# Patient Record
Sex: Female | Born: 1990 | Race: Black or African American | Hispanic: No | Marital: Single | Smoking: Never smoker
Health system: Southern US, Community
[De-identification: ages and names within clinical notes are randomized; demographics above are authoritative.]

## PROBLEM LIST (undated history)

## (undated) DIAGNOSIS — E109 Type 1 diabetes mellitus without complications: Secondary | ICD-10-CM

## (undated) DIAGNOSIS — B019 Varicella without complication: Secondary | ICD-10-CM

## (undated) DIAGNOSIS — B977 Papillomavirus as the cause of diseases classified elsewhere: Secondary | ICD-10-CM

## (undated) DIAGNOSIS — N390 Urinary tract infection, site not specified: Secondary | ICD-10-CM

## (undated) DIAGNOSIS — E119 Type 2 diabetes mellitus without complications: Secondary | ICD-10-CM

## (undated) DIAGNOSIS — R87629 Unspecified abnormal cytological findings in specimens from vagina: Secondary | ICD-10-CM

## (undated) DIAGNOSIS — E1110 Type 2 diabetes mellitus with ketoacidosis without coma: Secondary | ICD-10-CM

## (undated) DIAGNOSIS — R000 Tachycardia, unspecified: Secondary | ICD-10-CM

## (undated) DIAGNOSIS — E669 Obesity, unspecified: Secondary | ICD-10-CM

## (undated) HISTORY — PX: COLPOSCOPY: SHX161

## (undated) HISTORY — PX: NO PAST SURGERIES: SHX2092

## (undated) HISTORY — DX: Varicella without complication: B01.9

## (undated) HISTORY — DX: Urinary tract infection, site not specified: N39.0

## (undated) HISTORY — DX: Unspecified abnormal cytological findings in specimens from vagina: R87.629

---
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
## 2009-08-12 ENCOUNTER — Other Ambulatory Visit: Admission: RE | Admit: 2009-08-12 | Discharge: 2009-08-12 | Payer: Self-pay | Admitting: Obstetrics and Gynecology

## 2010-03-15 ENCOUNTER — Other Ambulatory Visit: Admission: RE | Admit: 2010-03-15 | Discharge: 2010-03-15 | Payer: Self-pay | Admitting: Obstetrics and Gynecology

## 2010-09-13 ENCOUNTER — Other Ambulatory Visit: Payer: Self-pay | Admitting: Nurse Practitioner

## 2010-09-13 ENCOUNTER — Other Ambulatory Visit (HOSPITAL_COMMUNITY)
Admission: RE | Admit: 2010-09-13 | Discharge: 2010-09-13 | Disposition: A | Discharge status: Home / Self Care | Payer: 59 | Source: Ambulatory Visit | Attending: Obstetrics and Gynecology | Admitting: Obstetrics and Gynecology

## 2010-09-13 DIAGNOSIS — Z113 Encounter for screening for infections with a predominantly sexual mode of transmission: Secondary | ICD-10-CM | POA: Insufficient documentation

## 2010-09-13 DIAGNOSIS — Z01419 Encounter for gynecological examination (general) (routine) without abnormal findings: Secondary | ICD-10-CM | POA: Insufficient documentation

## 2011-09-18 ENCOUNTER — Other Ambulatory Visit (HOSPITAL_COMMUNITY)
Admission: RE | Admit: 2011-09-18 | Discharge: 2011-09-18 | Disposition: A | Discharge status: Home / Self Care | Payer: 59 | Source: Ambulatory Visit | Attending: Obstetrics and Gynecology | Admitting: Obstetrics and Gynecology

## 2011-09-18 ENCOUNTER — Other Ambulatory Visit: Payer: Self-pay | Admitting: Nurse Practitioner

## 2011-09-18 DIAGNOSIS — Z01419 Encounter for gynecological examination (general) (routine) without abnormal findings: Secondary | ICD-10-CM | POA: Insufficient documentation

## 2011-09-18 DIAGNOSIS — N760 Acute vaginitis: Secondary | ICD-10-CM | POA: Insufficient documentation

## 2011-09-18 DIAGNOSIS — Z113 Encounter for screening for infections with a predominantly sexual mode of transmission: Secondary | ICD-10-CM | POA: Insufficient documentation

## 2012-09-18 ENCOUNTER — Other Ambulatory Visit (HOSPITAL_COMMUNITY)
Admission: RE | Admit: 2012-09-18 | Discharge: 2012-09-18 | Disposition: A | Discharge status: Home / Self Care | Payer: 59 | Source: Ambulatory Visit | Attending: Obstetrics and Gynecology | Admitting: Obstetrics and Gynecology

## 2012-09-18 ENCOUNTER — Other Ambulatory Visit: Payer: Self-pay | Admitting: Nurse Practitioner

## 2012-09-18 DIAGNOSIS — N760 Acute vaginitis: Secondary | ICD-10-CM | POA: Insufficient documentation

## 2012-09-18 DIAGNOSIS — Z113 Encounter for screening for infections with a predominantly sexual mode of transmission: Secondary | ICD-10-CM | POA: Insufficient documentation

## 2013-09-03 ENCOUNTER — Other Ambulatory Visit: Payer: Self-pay | Admitting: Nurse Practitioner

## 2013-09-03 ENCOUNTER — Other Ambulatory Visit (HOSPITAL_COMMUNITY)
Admission: RE | Admit: 2013-09-03 | Discharge: 2013-09-03 | Disposition: A | Discharge status: Home / Self Care | Payer: 59 | Source: Ambulatory Visit | Attending: Nurse Practitioner | Admitting: Nurse Practitioner

## 2013-09-03 DIAGNOSIS — Z113 Encounter for screening for infections with a predominantly sexual mode of transmission: Secondary | ICD-10-CM | POA: Insufficient documentation

## 2013-09-03 DIAGNOSIS — Z01419 Encounter for gynecological examination (general) (routine) without abnormal findings: Secondary | ICD-10-CM | POA: Insufficient documentation

## 2013-10-01 ENCOUNTER — Ambulatory Visit (INDEPENDENT_AMBULATORY_CARE_PROVIDER_SITE_OTHER): Payer: 59 | Admitting: Physician Assistant

## 2013-10-01 VITALS — BP 110/72 | HR 81 | Temp 99.5°F | Resp 18 | Ht 64.5 in | Wt 155.0 lb

## 2013-10-01 DIAGNOSIS — J029 Acute pharyngitis, unspecified: Secondary | ICD-10-CM

## 2013-10-01 DIAGNOSIS — R05 Cough: Secondary | ICD-10-CM

## 2013-10-01 DIAGNOSIS — R059 Cough, unspecified: Secondary | ICD-10-CM

## 2013-10-01 LAB — POCT RAPID STREP A (OFFICE): Rapid Strep A Screen: NEGATIVE

## 2013-10-01 MED ORDER — FIRST-DUKES MOUTHWASH MT SUSP: 5.0000 mL | OROMUCOSAL | Status: DC | PRN

## 2013-10-01 NOTE — Progress Notes (Signed)
   Subjective:    Patient ID: Tammy Richards, female    DOB: 04/15/1991, 23 y.o.   MRN: [id number]  HPI 23 year old female presents for evaluation of sore throat. Symptoms started yesterday and have persisted through today.  She states it is painful to swallow despite use of cough drops.   Took Nyquil last night and Dayquil today which did not help much. Admits to slight "tickle" in her throat that is causing a dry cough. No SOB, wheezing, nasal congestion, otalgia, sinus pain, headache, fever, chills, nausea, or vomiting.  No significant hx of frequent strep infections or known strep contacts.  Patient is otherwise healthy with no other concerns today.     Review of Systems  Constitutional: Negative for fever and chills.  HENT: Positive for sore throat. Negative for congestion, postnasal drip, rhinorrhea, sinus pressure and trouble swallowing.   Respiratory: Positive for cough. Negative for chest tightness, shortness of breath and wheezing.   Cardiovascular: Negative for chest pain.  Gastrointestinal: Negative for nausea, vomiting and abdominal pain.  Neurological: Negative for dizziness and headaches.       Objective:   Physical Exam  Constitutional: She is oriented to person, place, and time. She appears well-developed and well-nourished.  HENT:  Head: Normocephalic and atraumatic.  Right Ear: Hearing, tympanic membrane, external ear and ear canal normal.  Left Ear: Hearing, tympanic membrane, external ear and ear canal normal.  Mouth/Throat: Uvula is midline and mucous membranes are normal. Posterior oropharyngeal erythema present. No oropharyngeal exudate, posterior oropharyngeal edema or tonsillar abscesses.  Eyes: Conjunctivae are normal.  Neck: Normal range of motion. Neck supple.  Cardiovascular: Normal rate, regular rhythm and normal heart sounds.   Pulmonary/Chest: Effort normal and breath sounds normal.  Lymphadenopathy:    She has no cervical adenopathy.  Neurological: She  is alert and oriented to person, place, and time.  Psychiatric: She has a normal mood and affect. Her behavior is normal. Judgment and thought content normal.          Results for orders placed in visit on 10/01/13  POCT RAPID STREP A (OFFICE)      Result Value Ref Range   Rapid Strep A Screen Negative  Negative    Assessment & Plan:  Acute pharyngitis - Plan: POCT rapid strep A, Culture, Group A Strep, Diphenhyd-Hydrocort-Nystatin (FIRST-DUKES MOUTHWASH) SUSP  Viral pharyngitis  Cough  Very likely viral pharyngitis. Will treat with conservative measures.  Recommend ibuprofen 800 mg tid with food.  D[facility]'s mouthwash q2-3hours prn pain.  Throat culture sent RTC precautions discussed.

## 2013-10-01 NOTE — Patient Instructions (Signed)
Ibuprofen 800 mg three times a day with food Use [facility]'s mouthwash every 2-3 hours as needed for pain Return if fever >100, trouble swallowing, or worsening cough     Viral Pharyngitis Viral pharyngitis is a viral infection that produces redness, pain, and swelling (inflammation) of the throat. It can spread from person to person (contagious). CAUSES Viral pharyngitis is caused by inhaling a large amount of certain germs called viruses. Many different viruses cause viral pharyngitis. SYMPTOMS Symptoms of viral pharyngitis include:  Sore throat.  Tiredness.  Stuffy nose.  Low-grade fever.  Congestion.  Cough. TREATMENT Treatment includes rest, drinking plenty of fluids, and the use of over-the-counter medication (approved by your caregiver). HOME CARE INSTRUCTIONS   Drink enough fluids to keep your urine clear or pale yellow.  Eat soft, cold foods such as ice cream, frozen ice pops, or gelatin dessert.  Gargle with warm salt water (1 tsp salt per 1 qt of water).  If over age 127, throat lozenges may be used safely.  Only take over-the-counter or prescription medicines for pain, discomfort, or fever as directed by your caregiver. Do not take aspirin. To help prevent spreading viral pharyngitis to others, avoid:  Mouth-to-mouth contact with others.  Sharing utensils for eating and drinking.  Coughing around others. SEEK MEDICAL CARE IF:   You are better in a few days, then become worse.  You have a fever or pain not helped by pain medicines.  There are any other changes that concern you. Document Released: 04/25/2005 Document Revised: 10/08/2011 Document Reviewed: 09/21/2010 [facility] Patient Information 2014 [place], [place].

## 2013-10-04 LAB — CULTURE, GROUP A STREP: Organism ID, Bacteria: NORMAL

## 2013-10-05 ENCOUNTER — Other Ambulatory Visit: Payer: Self-pay | Admitting: Physician Assistant

## 2013-10-05 MED ORDER — AMOXICILLIN 875 MG PO TABS: 875.0000 mg | ORAL_TABLET | Freq: Two times a day (BID) | ORAL | Status: DC

## 2014-06-22 ENCOUNTER — Ambulatory Visit (INDEPENDENT_AMBULATORY_CARE_PROVIDER_SITE_OTHER): Payer: 59 | Admitting: Internal Medicine

## 2014-06-22 VITALS — BP 120/74 | HR 86 | Temp 98.6°F | Resp 18 | Ht 63.75 in | Wt 167.8 lb

## 2014-06-22 DIAGNOSIS — J018 Other acute sinusitis: Secondary | ICD-10-CM

## 2014-06-22 DIAGNOSIS — R059 Cough, unspecified: Secondary | ICD-10-CM

## 2014-06-22 DIAGNOSIS — R05 Cough: Secondary | ICD-10-CM

## 2014-06-22 MED ORDER — HYDROCODONE-ACETAMINOPHEN 7.5-325 MG/15ML PO SOLN: 10.0000 mL | Freq: Four times a day (QID) | ORAL | Status: DC | PRN

## 2014-06-22 MED ORDER — AMOXICILLIN 500 MG PO CAPS: 1000.0000 mg | ORAL_CAPSULE | Freq: Two times a day (BID) | ORAL | Status: DC

## 2014-06-22 NOTE — Patient Instructions (Signed)
Allergic Rhinitis Allergic rhinitis is when the mucous membranes in the nose respond to allergens. Allergens are particles in the air that cause your body to have an allergic reaction. This causes you to release allergic antibodies. Through a chain of events, these eventually cause you to release histamine into the blood stream. Although meant to protect the body, it is this release of histamine that causes your discomfort, such as frequent sneezing, congestion, and an itchy, runny nose.  CAUSES  Seasonal allergic rhinitis (hay fever) is caused by pollen allergens that may come from grasses, trees, and weeds. Year-round allergic rhinitis (perennial allergic rhinitis) is caused by allergens such as house dust mites, pet dander, and mold spores.  SYMPTOMS   Nasal stuffiness (congestion).  Itchy, runny nose with sneezing and tearing of the eyes. DIAGNOSIS  Your health care provider can help you determine the allergen or allergens that trigger your symptoms. If you and your health care provider are unable to determine the allergen, skin or blood testing may be used. TREATMENT  Allergic rhinitis does not have a cure, but it can be controlled by:  Medicines and allergy shots (immunotherapy).  Avoiding the allergen. Hay fever may often be treated with antihistamines in pill or nasal spray forms. Antihistamines block the effects of histamine. There are over-the-counter medicines that may help with nasal congestion and swelling around the eyes. Check with your health care provider before taking or giving this medicine.  If avoiding the allergen or the medicine prescribed do not work, there are many new medicines your health care provider can prescribe. Stronger medicine may be used if initial measures are ineffective. Desensitizing injections can be used if medicine and avoidance does not work. Desensitization is when a patient is given ongoing shots until the body becomes less sensitive to the allergen.  Make sure you follow up with your health care provider if problems continue. HOME CARE INSTRUCTIONS It is not possible to completely avoid allergens, but you can reduce your symptoms by taking steps to limit your exposure to them. It helps to know exactly what you are allergic to so that you can avoid your specific triggers. SEEK MEDICAL CARE IF:   You have a fever.  You develop a cough that does not stop easily (persistent).  You have shortness of breath.  You start wheezing.  Symptoms interfere with normal daily activities. Document Released: 04/10/2001 Document Revised: 07/21/2013 Document Reviewed: 03/23/2013 ExitCare Patient Information 2015 [employer]. This information is not intended to replace advice given to you by your health care provider. Make sure you discuss any questions you have with your health care provider. Sinusitis Sinusitis is redness, soreness, and inflammation of the paranasal sinuses. Paranasal sinuses are air pockets within the bones of your face (beneath the eyes, the middle of the forehead, or above the eyes). In healthy paranasal sinuses, mucus is able to drain out, and air is able to circulate through them by way of your nose. However, when your paranasal sinuses are inflamed, mucus and air can become trapped. This can allow bacteria and other germs to grow and cause infection. Sinusitis can develop quickly and last only a short time (acute) or continue over a long period (chronic). Sinusitis that lasts for more than 12 weeks is considered chronic.  CAUSES  Causes of sinusitis include:  Allergies.  Structural abnormalities, such as displacement of the cartilage that separates your nostrils (deviated septum), which can decrease the air flow through your nose and sinuses and affect sinus   drainage.  Functional abnormalities, such as when the small hairs (cilia) that line your sinuses and help remove mucus do not work properly or are not present. SIGNS AND  SYMPTOMS  Symptoms of acute and chronic sinusitis are the same. The primary symptoms are pain and pressure around the affected sinuses. Other symptoms include:  Upper toothache.  Earache.  Headache.  Bad breath.  Decreased sense of smell and taste.  A cough, which worsens when you are lying flat.  Fatigue.  Fever.  Thick drainage from your nose, which often is green and may contain pus (purulent).  Swelling and warmth over the affected sinuses. DIAGNOSIS  Your health care provider will perform a physical exam. During the exam, your health care provider may:  Look in your nose for signs of abnormal growths in your nostrils (nasal polyps).  Tap over the affected sinus to check for signs of infection.  View the inside of your sinuses (endoscopy) using an imaging device that has a light attached (endoscope). If your health care provider suspects that you have chronic sinusitis, one or more of the following tests may be recommended:  Allergy tests.  Nasal culture. A sample of mucus is taken from your nose, sent to a lab, and screened for bacteria.  Nasal cytology. A sample of mucus is taken from your nose and examined by your health care provider to determine if your sinusitis is related to an allergy. TREATMENT  Most cases of acute sinusitis are related to a viral infection and will resolve on their own within 10 days. Sometimes medicines are prescribed to help relieve symptoms (pain medicine, decongestants, nasal steroid sprays, or saline sprays).  However, for sinusitis related to a bacterial infection, your health care provider will prescribe antibiotic medicines. These are medicines that will help kill the bacteria causing the infection.  Rarely, sinusitis is caused by a fungal infection. In theses cases, your health care provider will prescribe antifungal medicine. For some cases of chronic sinusitis, surgery is needed. Generally, these are cases in which sinusitis recurs  more than 3 times per year, despite other treatments. HOME CARE INSTRUCTIONS   Drink plenty of water. Water helps thin the mucus so your sinuses can drain more easily.  Use a humidifier.  Inhale steam 3 to 4 times a day (for example, sit in the bathroom with the shower running).  Apply a warm, moist washcloth to your face 3 to 4 times a day, or as directed by your health care provider.  Use saline nasal sprays to help moisten and clean your sinuses.  Take medicines only as directed by your health care provider.  If you were prescribed either an antibiotic or antifungal medicine, finish it all even if you start to feel better. SEEK IMMEDIATE MEDICAL CARE IF:  You have increasing pain or severe headaches.  You have nausea, vomiting, or drowsiness.  You have swelling around your face.  You have vision problems.  You have a stiff neck.  You have difficulty breathing. MAKE SURE YOU:   Understand these instructions.  Will watch your condition.  Will get help right away if you are not doing well or get worse. Document Released: 07/16/2005 Document Revised: 11/30/2013 Document Reviewed: 07/31/2011 ExitCare Patient Information 2015 Exit[employer], [employer]. This information is not intended to replace advice given to you by your health care provider. Make sure you discuss any questions you have with your health care provider.  

## 2014-06-22 NOTE — Progress Notes (Signed)
   Subjective:    Patient ID: Tammy Richards, female    DOB: 06/17/1991, 23 y.o.   MRN: [id number]  HPI A 23 year old female is here with complaints of sinus issues that started on last Saturday, 06/19/2014. Patient has history of acute pharyngitis dated back in March 2015. She states that she has had an illness similar to the one she has now that lasted around 6-7 weeks. Patient states she does have allergies but not necessarily during the spring time.  She usually takes OTC allergy medicines when needed.  She is presented with minor facial sinus pressure, nasal congestion and is producing yellow sputum.  She notes that she does not have an headache but describes it as pressure buildup between her eyes.   She states that she has occassionally cough.  She states she has not been around any one else that has been sick.  She states she does not have any history of asthma.  Patient noted that she is an non-smoker and is not around anyone that does smoke.    Patient has tried OTC Alka-Seltzer Plus Severe Sinus Congestion and Cough with no relief.  Patient states that her liquid and food intake are normal.  She also states that she is sleeping fine without having any interruptions due to her sickness.  She denies having any fever, chills, sore throat, nausea or vomiting associated with her issues today.   Patient states she is going out of town on Sunday going to [place] and wants to be feeling better by then.   Review of Systems     Objective:   Physical Exam  Constitutional: She is oriented to person, place, and time. She appears well-developed and well-nourished.  HENT:  Head: Normocephalic.  Right Ear: External ear normal.  Left Ear: External ear normal.  Nose: Mucosal edema, rhinorrhea and sinus tenderness present. Right sinus exhibits maxillary sinus tenderness. Right sinus exhibits no frontal sinus tenderness. Left sinus exhibits maxillary sinus tenderness. Left sinus exhibits no frontal  sinus tenderness.  Mouth/Throat: Oropharynx is clear and moist.  Eyes: Conjunctivae and EOM are normal. Pupils are equal, round, and reactive to light.  Cardiovascular: Normal rate.   Neurological: She is alert and oriented to person, place, and time. A cranial nerve deficit is present. She exhibits normal muscle tone. Coordination normal.  Psychiatric: She has a normal mood and affect.  Vitals reviewed.         Assessment & Plan:  Sinusitis/Cough Amoxil/Tussionex

## 2014-12-28 ENCOUNTER — Telehealth: Payer: Self-pay | Admitting: Family Medicine

## 2014-12-28 NOTE — Telephone Encounter (Signed)
Sorry but I am too full  

## 2014-12-28 NOTE — Telephone Encounter (Signed)
Pt not seen in several years.  Would like to know if you will accept her back as a pt?

## 2015-01-10 NOTE — Telephone Encounter (Signed)
Pt has been sch w/cory °

## 2015-02-03 ENCOUNTER — Encounter: Payer: Self-pay | Admitting: Adult Health

## 2015-02-03 ENCOUNTER — Ambulatory Visit (INDEPENDENT_AMBULATORY_CARE_PROVIDER_SITE_OTHER): Payer: Commercial Managed Care - HMO | Admitting: Adult Health

## 2015-02-03 VITALS — BP 122/88 | Temp 98.9°F | Ht 63.0 in | Wt 186.7 lb

## 2015-02-03 DIAGNOSIS — Z0000 Encounter for general adult medical examination without abnormal findings: Secondary | ICD-10-CM | POA: Diagnosis not present

## 2015-02-03 DIAGNOSIS — Z23 Encounter for immunization: Secondary | ICD-10-CM

## 2015-02-03 DIAGNOSIS — R300 Dysuria: Secondary | ICD-10-CM

## 2015-02-03 LAB — POCT URINALYSIS DIPSTICK
BILIRUBIN UA: NEGATIVE
Glucose, UA: NEGATIVE
Nitrite, UA: NEGATIVE
PH UA: 5.5
Protein, UA: NEGATIVE
RBC UA: NEGATIVE
Spec Grav, UA: >=1.03
Urobilinogen, UA: 0.2

## 2015-02-03 LAB — CBC WITH DIFFERENTIAL/PLATELET
Basophils Absolute: 0 10*3/uL (ref 0.0–0.1)
Basophils Relative: 0.6 % (ref 0.0–3.0)
EOS PCT: 1.8 % (ref 0.0–5.0)
Eosinophils Absolute: 0.1 10*3/uL (ref 0.0–0.7)
HCT: 43.6 % (ref 36.0–46.0)
Hemoglobin: 14.2 g/dL (ref 12.0–15.0)
Lymphocytes Relative: 35.7 % (ref 12.0–46.0)
Lymphs Abs: 2.9 10*3/uL (ref 0.7–4.0)
MCHC: 32.5 g/dL (ref 30.0–36.0)
MCV: 86.4 fl (ref 78.0–100.0)
MONO ABS: 0.5 10*3/uL (ref 0.1–1.0)
Monocytes Relative: 6.6 % (ref 3.0–12.0)
NEUTROS PCT: 55.3 % (ref 43.0–77.0)
Neutro Abs: 4.5 10*3/uL (ref 1.4–7.7)
PLATELETS: 228 10*3/uL (ref 150.0–400.0)
RBC: 5.05 Mil/uL (ref 3.87–5.11)
RDW: 13 % (ref 11.5–15.5)
WBC: 8.2 10*3/uL (ref 4.0–10.5)

## 2015-02-03 LAB — BASIC METABOLIC PANEL
BUN: 10 mg/dL (ref 6–23)
CO2: 28 mEq/L (ref 19–32)
Calcium: 10 mg/dL (ref 8.4–10.5)
Chloride: 100 mEq/L (ref 96–112)
Creatinine, Ser: 0.83 mg/dL (ref 0.40–1.20)
GFR: 107.42 mL/min (ref >60.00)
Glucose, Bld: 83 mg/dL (ref 70–99)
POTASSIUM: 3.7 meq/L (ref 3.5–5.1)
Sodium: 136 mEq/L (ref 135–145)

## 2015-02-03 LAB — HEPATIC FUNCTION PANEL
ALK PHOS: 81 U/L (ref 39–117)
ALT: 54 U/L — ABNORMAL HIGH (ref 0–35)
AST: 54 U/L — AB (ref 0–37)
Albumin: 4.3 g/dL (ref 3.5–5.2)
Bilirubin, Direct: 0.1 mg/dL (ref 0.0–0.3)
TOTAL PROTEIN: 8.3 g/dL (ref 6.0–8.3)
Total Bilirubin: 0.5 mg/dL (ref 0.2–1.2)

## 2015-02-03 LAB — TSH: TSH: 0.85 u[IU]/mL (ref 0.35–4.50)

## 2015-02-03 LAB — HIV ANTIBODY (ROUTINE TESTING W REFLEX): HIV: NONREACTIVE

## 2015-02-03 LAB — HEMOGLOBIN A1C: Hgb A1c MFr Bld: 5.8 % (ref 4.6–6.5)

## 2015-02-03 NOTE — Progress Notes (Signed)
HPI:  Tammy Richards is here to establish care.  Last PCP and physical:" Many years ago"  Has the following chronic problems that require follow up and concerns today:  She has no issues she would like to talk about today   ROS negative for unless reported above: fevers, chills,feeling poorly, unintentional weight loss, hearing or vision loss, chest pain, palpitations, leg claudication, struggling to breath,Not feeling congested in the chest, no orthopenia, no cough,no wheezing, normal appetite, no soft tissue swelling, no hemoptysis, melena, hematochezia, hematuria, falls, loc, si, or thoughts of self harm.  Immunizations:UTD Diet:Eats healthy, lean meats, fruits and vegetables. Exercise: Working with Systems analyst.  Colonoscopy: Pap Smear:2/16, Had abnormal pap in 2014, has low grade lesions in the past.  Dentist: twice year Eye: Yearly  Past Medical History  Diagnosis Date  . Chicken pox   . UTI (urinary tract infection)     History reviewed. No pertinent past surgical history.  Family History  Problem Relation Age of Onset  . Thyroid disease Maternal Grandmother   . Hyperlipidemia Paternal Grandmother   . Hyperlipidemia Paternal Grandfather   . Lung cancer Paternal Grandfather   . Arthritis Paternal Grandmother   . Thyroid disease Mother   . Breast cancer Maternal Aunt   . Lung cancer Maternal Aunt   . Stroke      maternal great grandmother  . Hypertension      maternal great grandmother    History   Social History  . Marital Status: Single    Spouse Name: N/A  . Number of Children: N/A  . Years of Education: N/A   Social History Main Topics  . Smoking status: Never Smoker   . Smokeless tobacco: Not on file  . Alcohol Use: 0.0 oz/week    0 Standard drinks or equivalent per week     Comment: socially; once a month   . Drug Use: No  . Sexual Activity: Not on file   Other Topics Concern  . None   Social History Narrative    No current  outpatient prescriptions on file.  EXAM:  Filed Vitals:   02/03/15 1301  BP: 122/88  Temp: 98.9 F (37.2 C)    Body mass index is 33.08 kg/(m^2).  GENERAL: vitals reviewed and listed above, alert, oriented, appears well hydrated and in no acute distress  HEENT: atraumatic, conjunttiva clear, no obvious abnormalities on inspection of external nose and ears  NECK: Neck is soft and supple without masses, no adenopathy or thyromegaly, trachea midline, no JVD. Normal range of motion.   LUNGS: clear to auscultation bilaterally, no wheezes, rales or rhonchi, good air movement  CV: Regular rate and rhythm, normal S1/S2, no audible murmurs, gallops, or rubs. No carotid bruit and no peripheral edema.   MS: moves all extremities without noticeable abnormality. No edema noted  Abd: soft/nontender/nondistended/normal bowel sounds   Skin: warm and dry, no rash   Extremities: No clubbing, cyanosis, or edema. Capillary refill is WNL. Pulses intact bilaterally in upper and lower extremities.   Neuro: CN II-XII intact, sensation and reflexes normal throughout, 5/5 muscle strength in bilateral upper and lower extremities. Normal finger to nose. Normal rapid alternating movements. Normal romberg. No pronator drift.   PSYCH: pleasant and cooperative, no obvious depression or anxiety  ASSESSMENT AND PLAN:   1. Routine general medical examination at a health care facility - HIV antibody - Basic metabolic panel - Hemoglobin A1c - Hepatic function panel - POCT urinalysis dipstick - TSH -  CBC with Differential/Platelet - Follow up once labs are back  2. Need for vaccination for DTaP - Tdap vaccine greater than or equal to 7yo IM    No diagnosis found. -We reviewed the PMH, PSH, FH, SH, Meds and Allergies. -We provided refills for any medications we will prescribe as needed. -We addressed current concerns per orders and patient instructions. -We have asked for records for pertinent  exams, studies, vaccines and notes from previous providers. -We have advised patient to follow up per instructions below.   -Patient advised to return or notify a provider immediately if symptoms worsen or persist or new concerns arise.  There are no Patient Instructions on file for this visit.   [employer] CorporationCory Amerie Beaumont

## 2015-02-03 NOTE — Patient Instructions (Addendum)
It was great meeting you today! I will follow up with you regarding your labs. You can follow up with me in a year or sooner if needed.   Please let me know if you need anything.    Health Maintenance Adopting a healthy lifestyle and getting preventive care can go a long way to promote health and wellness. Talk with your health care provider about what schedule of regular examinations is right for you. This is a good chance for you to check in with your provider about disease prevention and staying healthy. In between checkups, there are plenty of things you can do on your own. Experts have done a lot of research about which lifestyle changes and preventive measures are most likely to keep you healthy. Ask your health care provider for more information. WEIGHT AND DIET  Eat a healthy diet  Be sure to include plenty of vegetables, fruits, low-fat dairy products, and lean protein.  Do not eat a lot of foods high in solid fats, added sugars, or salt.  Get regular exercise. This is one of the most important things you can do for your health.  Most adults should exercise for at least 150 minutes each week. The exercise should increase your heart rate and make you sweat (moderate-intensity exercise).  Most adults should also do strengthening exercises at least twice a week. This is in addition to the moderate-intensity exercise.  Maintain a healthy weight  Body mass index (BMI) is a measurement that can be used to identify possible weight problems. It estimates body fat based on height and weight. Your health care provider can help determine your BMI and help you achieve or maintain a healthy weight.  For females 69 years of age and older:   A BMI below 18.5 is considered underweight.  A BMI of 18.5 to 24.9 is normal.  A BMI of 25 to 29.9 is considered overweight.  A BMI of 30 and above is considered obese.  Watch levels of cholesterol and blood lipids  You should start having your  blood tested for lipids and cholesterol at 25 years of age, then have this test every 5 years.  You may need to have your cholesterol levels checked more often if:  Your lipid or cholesterol levels are high.  You are older than 25 years of age.  You are at high risk for heart disease.  CANCER SCREENING   Lung Cancer  Lung cancer screening is recommended for adults 20-44 years old who are at high risk for lung cancer because of a history of smoking.  A yearly low-dose CT scan of the lungs is recommended for people who:  Currently smoke.  Have quit within the past 15 years.  Have at least a 30-pack-year history of smoking. A pack year is smoking an average of one pack of cigarettes a day for 1 year.  Yearly screening should continue until it has been 15 years since you quit.  Yearly screening should stop if you develop a health problem that would prevent you from having lung cancer treatment.  Breast Cancer  Practice breast self-awareness. This means understanding how your breasts normally appear and feel.  It also means doing regular breast self-exams. Let your health care provider know about any changes, no matter how small.  If you are in your 20s or 30s, you should have a clinical breast exam (CBE) by a health care provider every 1-3 years as part of a regular health exam.  If you  are 38 or older, have a CBE every year. Also consider having a breast X-ray (mammogram) every year.  If you have a family history of breast cancer, talk to your health care provider about genetic screening.  If you are at high risk for breast cancer, talk to your health care provider about having an MRI and a mammogram every year.  Breast cancer gene (BRCA) assessment is recommended for women who have family members with BRCA-related cancers. BRCA-related cancers include:  Breast.  Ovarian.  Tubal.  Peritoneal cancers.  Results of the assessment will determine the need for genetic  counseling and BRCA1 and BRCA2 testing. Cervical Cancer Routine pelvic examinations to screen for cervical cancer are no longer recommended for nonpregnant women who are considered low risk for cancer of the pelvic organs (ovaries, uterus, and vagina) and who do not have symptoms. A pelvic examination may be necessary if you have symptoms including those associated with pelvic infections. Ask your health care provider if a screening pelvic exam is right for you.   The Pap test is the screening test for cervical cancer for women who are considered at risk.  If you had a hysterectomy for a problem that was not cancer or a condition that could lead to cancer, then you no longer need Pap tests.  If you are older than 65 years, and you have had normal Pap tests for the past 10 years, you no longer need to have Pap tests.  If you have had past treatment for cervical cancer or a condition that could lead to cancer, you need Pap tests and screening for cancer for at least 20 years after your treatment.  If you no longer get a Pap test, assess your risk factors if they change (such as having a new sexual partner). This can affect whether you should start being screened again.  Some women have medical problems that increase their chance of getting cervical cancer. If this is the case for you, your health care provider may recommend more frequent screening and Pap tests.  The human papillomavirus (HPV) test is another test that may be used for cervical cancer screening. The HPV test looks for the virus that can cause cell changes in the cervix. The cells collected during the Pap test can be tested for HPV.  The HPV test can be used to screen women 51 years of age and older. Getting tested for HPV can extend the interval between normal Pap tests from three to five years.  An HPV test also should be used to screen women of any age who have unclear Pap test results.  After 25 years of age, women should have  HPV testing as often as Pap tests.  Colorectal Cancer  This type of cancer can be detected and often prevented.  Routine colorectal cancer screening usually begins at 25 years of age and continues through 25 years of age.  Your health care provider may recommend screening at an earlier age if you have risk factors for colon cancer.  Your health care provider may also recommend using home test kits to check for hidden blood in the stool.  A small camera at the end of a tube can be used to examine your colon directly (sigmoidoscopy or colonoscopy). This is done to check for the earliest forms of colorectal cancer.  Routine screening usually begins at age 59.  Direct examination of the colon should be repeated every 5-10 years through 25 years of age. However, you may  need to be screened more often if early forms of precancerous polyps or small growths are found. Skin Cancer  Check your skin from head to toe regularly.  Tell your health care provider about any new moles or changes in moles, especially if there is a change in a mole's shape or color.  Also tell your health care provider if you have a mole that is larger than the size of a pencil eraser.  Always use sunscreen. Apply sunscreen liberally and repeatedly throughout the day.  Protect yourself by wearing long sleeves, pants, a wide-brimmed hat, and sunglasses whenever you are outside. HEART DISEASE, DIABETES, AND HIGH BLOOD PRESSURE   Have your blood pressure checked at least every 1-2 years. High blood pressure causes heart disease and increases the risk of stroke.  If you are between 73 years and 32 years old, ask your health care provider if you should take aspirin to prevent strokes.  Have regular diabetes screenings. This involves taking a blood sample to check your fasting blood sugar level.  If you are at a normal weight and have a low risk for diabetes, have this test once every three years after 25 years of  age.  If you are overweight and have a high risk for diabetes, consider being tested at a younger age or more often. PREVENTING INFECTION  Hepatitis B  If you have a higher risk for hepatitis B, you should be screened for this virus. You are considered at high risk for hepatitis B if:  You were born in a country where hepatitis B is common. Ask your health care provider which countries are considered high risk.  Your parents were born in a high-risk country, and you have not been immunized against hepatitis B (hepatitis B vaccine).  You have HIV or AIDS.  You use needles to inject street drugs.  You live with someone who has hepatitis B.  You have had sex with someone who has hepatitis B.  You get hemodialysis treatment.  You take certain medicines for conditions, including cancer, organ transplantation, and autoimmune conditions. Hepatitis C  Blood testing is recommended for:  Everyone born from [birth year] through 1966.  Anyone with known risk factors for hepatitis C. Sexually transmitted infections (STIs)  You should be screened for sexually transmitted infections (STIs) including gonorrhea and chlamydia if:  You are sexually active and are younger than 26 years of age.  You are older than 25 years of age and your health care provider tells you that you are at risk for this type of infection.  Your sexual activity has changed since you were last screened and you are at an increased risk for chlamydia or gonorrhea. Ask your health care provider if you are at risk.  If you do not have HIV, but are at risk, it may be recommended that you take a prescription medicine daily to prevent HIV infection. This is called pre-exposure prophylaxis (PrEP). You are considered at risk if:  You are sexually active and do not regularly use condoms or know the HIV status of your partner(s).  You take drugs by injection.  You are sexually active with a partner who has HIV. Talk with your health  care provider about whether you are at high risk of being infected with HIV. If you choose to begin PrEP, you should first be tested for HIV. You should then be tested every 3 months for as long as you are taking PrEP.  PREGNANCY   If you are  premenopausal and you may become pregnant, ask your health care provider about preconception counseling.  If you may become pregnant, take 400 to 800 micrograms (mcg) of folic acid every day.  If you want to prevent pregnancy, talk to your health care provider about birth control (contraception). OSTEOPOROSIS AND MENOPAUSE   Osteoporosis is a disease in which the bones lose minerals and strength with aging. This can result in serious bone fractures. Your risk for osteoporosis can be identified using a bone density scan.  If you are 3 years of age or older, or if you are at risk for osteoporosis and fractures, ask your health care provider if you should be screened.  Ask your health care provider whether you should take a calcium or vitamin D supplement to lower your risk for osteoporosis.  Menopause may have certain physical symptoms and risks.  Hormone replacement therapy may reduce some of these symptoms and risks. Talk to your health care provider about whether hormone replacement therapy is right for you.  HOME CARE INSTRUCTIONS   Schedule regular health, dental, and eye exams.  Stay current with your immunizations.   Do not use any tobacco products including cigarettes, chewing tobacco, or electronic cigarettes.  If you are pregnant, do not drink alcohol.  If you are breastfeeding, limit how much and how often you drink alcohol.  Limit alcohol intake to no more than 1 drink per day for nonpregnant women. One drink equals 12 ounces of beer, 5 ounces of wine, or 1 ounces of hard liquor.  Do not use street drugs.  Do not share needles.  Ask your health care provider for help if you need support or information about quitting  drugs.  Tell your health care provider if you often feel depressed.  Tell your health care provider if you have ever been abused or do not feel safe at home. Document Released: 01/29/2011 Document Revised: 11/30/2013 Document Reviewed: 06/17/2013 [employer] Patient Information 2015 [place], [place]. This information is not intended to replace advice given to you by your health care provider. Make sure you discuss any questions you have with your health care provider.

## 2015-02-05 LAB — URINE CULTURE

## 2015-02-07 ENCOUNTER — Telehealth: Payer: Self-pay | Admitting: Adult Health

## 2015-02-07 NOTE — Telephone Encounter (Signed)
Attempted to call regarding UC. No answer.

## 2015-02-08 ENCOUNTER — Other Ambulatory Visit: Payer: Self-pay | Admitting: Adult Health

## 2015-02-08 MED ORDER — PENICILLIN V POTASSIUM 500 MG PO TABS: 500.0000 mg | ORAL_TABLET | Freq: Three times a day (TID) | ORAL | Status: DC

## 2015-02-08 NOTE — Progress Notes (Signed)
Spoke with pt and pt is aware rx sent to [facility] [address].  Also advised pt about lab results.

## 2015-12-22 ENCOUNTER — Other Ambulatory Visit (INDEPENDENT_AMBULATORY_CARE_PROVIDER_SITE_OTHER): Payer: 59

## 2015-12-22 ENCOUNTER — Encounter: Payer: Self-pay | Admitting: Adult Health

## 2015-12-22 ENCOUNTER — Ambulatory Visit (INDEPENDENT_AMBULATORY_CARE_PROVIDER_SITE_OTHER): Payer: 59 | Admitting: Adult Health

## 2015-12-22 VITALS — BP 116/64 | Temp 98.1°F | Ht 63.0 in | Wt 184.2 lb

## 2015-12-22 DIAGNOSIS — R110 Nausea: Secondary | ICD-10-CM | POA: Diagnosis not present

## 2015-12-22 DIAGNOSIS — R631 Polydipsia: Secondary | ICD-10-CM

## 2015-12-22 DIAGNOSIS — E119 Type 2 diabetes mellitus without complications: Secondary | ICD-10-CM | POA: Diagnosis not present

## 2015-12-22 LAB — COMPREHENSIVE METABOLIC PANEL
ALK PHOS: 88 U/L (ref 39–117)
ALT: 19 U/L (ref 0–35)
AST: 20 U/L (ref 0–37)
Albumin: 4.8 g/dL (ref 3.5–5.2)
BILIRUBIN TOTAL: 0.6 mg/dL (ref 0.2–1.2)
BUN: 13 mg/dL (ref 6–23)
CO2: 18 mEq/L — ABNORMAL LOW (ref 19–32)
Calcium: 10.1 mg/dL (ref 8.4–10.5)
Chloride: 101 mEq/L (ref 96–112)
Creatinine, Ser: 0.94 mg/dL (ref 0.40–1.20)
GFR: 92.4 mL/min (ref >60.00)
GLUCOSE: 337 mg/dL — AB (ref 70–99)
Potassium: 3.9 mEq/L (ref 3.5–5.1)
SODIUM: 136 meq/L (ref 135–145)
TOTAL PROTEIN: 8.5 g/dL — AB (ref 6.0–8.3)

## 2015-12-22 LAB — HEMOGLOBIN A1C: Hgb A1c MFr Bld: 11.1 % — ABNORMAL HIGH (ref 4.6–6.5)

## 2015-12-22 LAB — CBC
HCT: 46 % (ref 36.0–46.0)
Hemoglobin: 15.5 g/dL — ABNORMAL HIGH (ref 12.0–15.0)
MCHC: 33.7 g/dL (ref 30.0–36.0)
MCV: 83.6 fl (ref 78.0–100.0)
Platelets: 226 10*3/uL (ref 150.0–400.0)
RBC: 5.5 Mil/uL — ABNORMAL HIGH (ref 3.87–5.11)
RDW: 12.5 % (ref 11.5–15.5)
WBC: 7.8 10*3/uL (ref 4.0–10.5)

## 2015-12-22 LAB — TSH: TSH: 1.38 u[IU]/mL (ref 0.35–4.50)

## 2015-12-22 MED ORDER — ONDANSETRON HCL 4 MG PO TABS: 4.0000 mg | ORAL_TABLET | Freq: Three times a day (TID) | ORAL | Status: DC | PRN

## 2015-12-22 NOTE — Progress Notes (Addendum)
Subjective:    Patient ID: Tammy Richards, female    DOB: 02-11-1991, 25 y.o.   MRN: [id number]  HPI  25 year old female who presents to the office today for polydipsia for the last two weeks. She reports that she drinks water all day and she just " urinates it all out"  She feels like her mouth is always dry and has constant fatigue with marked nausea but no vomiting.   She also reports 20 pounds of weight loss in the last few weeks. She is not exercising at all. Has not changed diet.   Review of Systems  Constitutional: Positive for fatigue. Negative for chills and diaphoresis.  Respiratory: Negative.   Cardiovascular: Negative.   Gastrointestinal: Positive for nausea. Negative for vomiting, diarrhea and constipation.  Genitourinary: Positive for urgency and frequency.  Neurological: Negative.    Past Medical History  Diagnosis Date  . Chicken pox   . UTI (urinary tract infection)     Social History   Social History  . Marital Status: Single    Spouse Name: N/A  . Number of Children: N/A  . Years of Education: N/A   Occupational History  . Not on file.   Social History Main Topics  . Smoking status: Never Smoker   . Smokeless tobacco: Not on file  . Alcohol Use: 0.0 oz/week    0 Standard drinks or equivalent per week     Comment: socially; once a month   . Drug Use: No  . Sexual Activity: Not on file   Other Topics Concern  . Not on file   Social History Narrative   Non clinical investigator for medical companies   Not married    Lives by herself    No children    Loves traveling and going to the beach.     No past surgical history on file.  Family History  Problem Relation Age of Onset  . Thyroid disease Maternal Grandmother   . Hyperlipidemia Paternal Grandmother   . Hyperlipidemia Paternal Grandfather   . Lung cancer Paternal Grandfather   . Arthritis Paternal Grandmother   . Thyroid disease Mother   . Breast cancer Maternal Aunt   . Lung cancer  Maternal Aunt   . Stroke      maternal great grandmother  . Hypertension      maternal great grandmother  . Diabetes Other     Maternal great grandmother  . Diabetes Paternal Grandmother     No Known Allergies  No current outpatient prescriptions on file prior to visit.   No current facility-administered medications on file prior to visit.    BP 116/64 mmHg  Temp(Src) 98.1 F (36.7 C) (Oral)  Ht  (1.6 m)  Wt 184 lb 3.2 oz (83.553 kg)  BMI 32.64 kg/m2  LMP 12/09/2015       Objective:   Physical Exam  Constitutional: She is oriented to person, place, and time. She appears well-developed and well-nourished. No distress.  HENT:  Head: Normocephalic and atraumatic.  Right Ear: External ear normal.  Left Ear: External ear normal.  Nose: Nose normal.  Mouth/Throat: Oropharynx is clear and moist. No oropharyngeal exudate.  Eyes: Conjunctivae and EOM are normal. Pupils are equal, round, and reactive to light. Right eye exhibits no discharge. Left eye exhibits no discharge. No scleral icterus.  Neck: Normal range of motion. Neck supple. No tracheal deviation present. No thyromegaly present.  Cardiovascular: Normal rate, regular rhythm, normal heart sounds and  intact distal pulses.  Exam reveals no gallop and no friction rub.   No murmur heard. Pulmonary/Chest: Effort normal and breath sounds normal. No respiratory distress. She has no wheezes. She has no rales. She exhibits no tenderness.  Lymphadenopathy:    She has no cervical adenopathy.  Neurological: She is alert and oriented to person, place, and time.  Skin: Skin is warm and dry. No rash noted. She is not diaphoretic. No erythema. No pallor.  Psychiatric: She has a normal mood and affect. Her behavior is normal. Judgment and thought content normal.  Nursing note and vitals reviewed.     Assessment & Plan:  1. Polydipsia - Concern for diabetes insipidus or Diabetes Mellitus Type 1   - TSH - Osmolality, 24 Hour  Urine - Osmolality - CMP - CBC - Will likely send to Endocrinology  - Stay hydrated with water and supplement with gatorade throughout the day 2. Nausea without vomiting  - ondansetron (ZOFRAN) 4 MG tablet; Take 1 tablet (4 mg total) by mouth every 8 (eight) hours as needed for nausea or vomiting.  Dispense: 20 tablet; Refill: 0     Shirline Freesory Conny Situ, NP

## 2015-12-22 NOTE — Patient Instructions (Signed)
It was great seeing you again.   I am going to get some lab work on you, after I get the results, I will call you.   We will figure out what to do then. You may need to get an MRI and see an Endocrinologist.   Stay hydrated with water and gatorade

## 2015-12-23 ENCOUNTER — Other Ambulatory Visit: Payer: Self-pay | Admitting: Adult Health

## 2015-12-23 ENCOUNTER — Ambulatory Visit (INDEPENDENT_AMBULATORY_CARE_PROVIDER_SITE_OTHER): Payer: 59 | Admitting: Adult Health

## 2015-12-23 ENCOUNTER — Encounter: Payer: Self-pay | Admitting: Adult Health

## 2015-12-23 ENCOUNTER — Telehealth: Payer: Self-pay | Admitting: Adult Health

## 2015-12-23 VITALS — BP 122/72 | Ht 63.0 in | Wt 182.4 lb

## 2015-12-23 DIAGNOSIS — E108 Type 1 diabetes mellitus with unspecified complications: Secondary | ICD-10-CM

## 2015-12-23 DIAGNOSIS — E1065 Type 1 diabetes mellitus with hyperglycemia: Principal | ICD-10-CM

## 2015-12-23 DIAGNOSIS — R358 Other polyuria: Secondary | ICD-10-CM

## 2015-12-23 DIAGNOSIS — R3589 Other polyuria: Secondary | ICD-10-CM

## 2015-12-23 DIAGNOSIS — IMO0001 Reserved for inherently not codable concepts without codable children: Secondary | ICD-10-CM

## 2015-12-23 LAB — POCT URINALYSIS DIPSTICK
Bilirubin, UA: NEGATIVE
Leukocytes, UA: NEGATIVE
NITRITE UA: NEGATIVE
PH UA: 5.5
PROTEIN UA: NEGATIVE
RBC UA: NEGATIVE
SPEC GRAV UA: 1.025
UROBILINOGEN UA: 0.2

## 2015-12-23 LAB — GLUCOSE, POCT (MANUAL RESULT ENTRY): POC GLUCOSE: 533 mg/dL — AB (ref 70–99)

## 2015-12-23 MED ORDER — BD SHARPS COLLECTOR MISC: Status: AC

## 2015-12-23 MED ORDER — GLUCOSE BLOOD VI STRP: ORAL_STRIP | Status: DC

## 2015-12-23 MED ORDER — ACCU-CHEK SOFTCLIX LANCETS MISC: Status: DC

## 2015-12-23 NOTE — Progress Notes (Addendum)
Subjective:    Patient ID: Tammy Richards, female    DOB: February 12, 1991, 25 y.o.   MRN: [id number]  HPI  Tammy Richards returns to the clinic today after being seen yesterday for polyuria, polydipsia, unintentional weight loss and fatigue. Her mother is with her at this visit.    Her labs showed a blood sugar of 337 and an A1c of 11.3 ( 10 months prior it was 5.8). Urinalysis today was positive for ketones and glucose.    Review of Systems  Constitutional: Positive for fatigue and unexpected weight change.  Gastrointestinal: Negative.   Genitourinary: Positive for urgency and frequency.       Frequent urination and enhanced thirst  All other systems reviewed and are negative.  Past Medical History  Diagnosis Date  . Chicken pox   . UTI (urinary tract infection)     Social History   Social History  . Marital Status: Single    Spouse Name: N/A  . Number of Children: N/A  . Years of Education: N/A   Occupational History  . Not on file.   Social History Main Topics  . Smoking status: Never Smoker   . Smokeless tobacco: Not on file  . Alcohol Use: 0.0 oz/week    0 Standard drinks or equivalent per week     Comment: socially; once a month   . Drug Use: No  . Sexual Activity: Not on file   Other Topics Concern  . Not on file   Social History Narrative   Non clinical investigator for medical companies   Not married    Lives by herself    No children    Loves traveling and going to the beach.     No past surgical history on file.  Family History  Problem Relation Age of Onset  . Thyroid disease Maternal Grandmother   . Hyperlipidemia Paternal Grandmother   . Hyperlipidemia Paternal Grandfather   . Lung cancer Paternal Grandfather   . Arthritis Paternal Grandmother   . Thyroid disease Mother   . Breast cancer Maternal Aunt   . Lung cancer Maternal Aunt   . Stroke      maternal great grandmother  . Hypertension      maternal great grandmother  . Diabetes Other       Maternal great grandmother  . Diabetes Paternal Grandmother     No Known Allergies  Current Outpatient Prescriptions on File Prior to Visit  Medication Sig Dispense Refill  . cetirizine (ZYRTEC) 10 MG tablet Take 10 mg by mouth daily.    Marland Kitchen levonorgestrel-ethinyl estradiol (AVIANE,ALESSE,LESSINA) 0.1-20 MG-MCG tablet Take 1 tablet by mouth daily.    . ondansetron (ZOFRAN) 4 MG tablet Take 1 tablet (4 mg total) by mouth every 8 (eight) hours as needed for nausea or vomiting. 20 tablet 0   No current facility-administered medications on file prior to visit.    BP 122/72 mmHg  Ht  (1.6 m)  Wt 182 lb 6.4 oz (82.736 kg)  BMI 32.32 kg/m2  LMP 12/09/2015       Objective:   Physical Exam  Constitutional: She is oriented to person, place, and time. She appears well-developed and well-nourished. No distress.  HENT:  No fruity smelling breath  Cardiovascular: Normal rate, regular rhythm, normal heart sounds and intact distal pulses.  Exam reveals no gallop and no friction rub.   No murmur heard. Pulmonary/Chest: Effort normal and breath sounds normal. No respiHiram Comberss. She has no wheezes. She has  no rales. She exhibits no tenderness.  Neurological: She is alert and oriented to person, place, and time.  No lethargy or drowsiness.    Skin: Skin is warm and dry. No rash noted. She is not diaphoretic. No erythema. No pallor.  Psychiatric: She has a normal mood and affect. Her behavior is normal. Judgment and thought content normal.  Nursing note and vitals reviewed.     Assessment & Plan:  1. 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 She was shown how to use the pen and she was able to demonstrate it in the office. She gave herself the first injection. I am going to start her off on 5 units due to not knowing how she will respond to insulin therapy. If she notices she is staying in the 300's then she can increase to 10 units daily.   - Pathophysiology, disease progression, nutrition and glycemic goals explained to patient and her mother  - Urgent referral to Endocrinology given  - glucose blood (ACCU-CHEK AVIVA PLUS) test strip; Use as instructed  Dispense: 100 each; Refill: 12 - ACCU-CHEK SOFTCLIX LANCETS lancets; Use as instructed  Dispense: 100 each; Refill: 12 Engineer, manufacturing systems- Sharps Container (BD SHARPS COLLECTOR) MISC; Use container to dispose of sharps  Dispense: 1 each; Refill: 0 - POCT Urinalysis Dipstick - POCT glucose (manual entry) - 533 ( she was drinking a gatorade in the office)  - Amb Referral to Nutrition and Diabetic E - She was advised to go to the ER with blood sugars above 500 or fatigue, confusion, vomiting or any mental status changes  Shirline Freesory Laren Orama, NP

## 2015-12-23 NOTE — Telephone Encounter (Signed)
Pt needs  one touch ultra glucometer and test strips and lancet [facility] [address]

## 2015-12-23 NOTE — Progress Notes (Deleted)
Patient presents to clinic today to establish care.  Acute Concerns:   Chronic Issues:   Health Maintenance: Dental -- Vision -- Immunizations -- Colonoscopy -- Mammogram -- PAP --  Bone Density --    Past Medical History  Diagnosis Date  . Chicken pox   . UTI (urinary tract infection)     No past surgical history on file.  Current Outpatient Prescriptions on File Prior to Visit  Medication Sig Dispense Refill  . cetirizine (ZYRTEC) 10 MG tablet Take 10 mg by mouth daily.    Marland Kitchen. levonorgestrel-ethinyl estradiol (AVIANE,ALESSE,LESSINA) 0.1-20 MG-MCG tablet Take 1 tablet by mouth daily.    . ondansetron (ZOFRAN) 4 MG tablet Take 1 tablet (4 mg total) by mouth every 8 (eight) hours as needed for nausea or vomiting. 20 tablet 0   No current facility-administered medications on file prior to visit.    No Known Allergies  Family History  Problem Relation Age of Onset  . Thyroid disease Maternal Grandmother   . Hyperlipidemia Paternal Grandmother   . Hyperlipidemia Paternal Grandfather   . Lung cancer Paternal Grandfather   . Arthritis Paternal Grandmother   . Thyroid disease Mother   . Breast cancer Maternal Aunt   . Lung cancer Maternal Aunt   . Stroke      maternal great grandmother  . Hypertension      maternal great grandmother  . Diabetes Other     Maternal great grandmother  . Diabetes Paternal Grandmother     Social History   Social History  . Marital Status: Single    Spouse Name: N/A  . Number of Children: N/A  . Years of Education: N/A   Occupational History  . Not on file.   Social History Main Topics  . Smoking status: Never Smoker   . Smokeless tobacco: Not on file  . Alcohol Use: 0.0 oz/week    0 Standard drinks or equivalent per week     Comment: socially; once a month   . Drug Use: No  . Sexual Activity: Not on file   Other Topics Concern  . Not on file   Social History Narrative   Non clinical investigator for medical  companies   Not married    Lives by herself    No children    Loves traveling and going to the beach.     ROS  BP 122/72 mmHg  Ht 5\' 3"  (1.6 m)  Wt 182 lb 6.4 oz (82.736 kg)  BMI 32.32 kg/m2  LMP 12/09/2015  Physical Exam  Recent Results (from the past 2160 hour(s))  TSH     Status: None   Collection Time: 12/22/15  8:18 AM  Result Value Ref Range   TSH 1.38 0.35 - 4.50 uIU/mL  CMP     Status: Abnormal   Collection Time: 12/22/15  8:18 AM  Result Value Ref Range   Sodium 136 135 - 145 mEq/L   Potassium 3.9 3.5 - 5.1 mEq/L   Chloride 101 96 - 112 mEq/L   CO2 18 (L) 19 - 32 mEq/L   Glucose, Bld 337 (H) 70 - 99 mg/dL   BUN 13 6 - 23 mg/dL   Creatinine, Ser 2.440.94 0.40 - 1.20 mg/dL   Total Bilirubin 0.6 0.2 - 1.2 mg/dL   Alkaline Phosphatase 88 39 - 117 U/L   AST 20 0 - 37 U/L   ALT 19 0 - 35 U/L   Total Protein 8.5 (H) 6.0 - 8.3 g/dL  Albumin 4.8 3.5 - 5.2 g/dL   Calcium 16.1 8.4 - 09.6 mg/dL   GFR 04.54 >09.81 mL/min  CBC     Status: Abnormal   Collection Time: 12/22/15  8:18 AM  Result Value Ref Range   WBC 7.8 4.0 - 10.5 K/uL   RBC 5.50 (H) 3.87 - 5.11 Mil/uL   Platelets 226.0 150.0 - 400.0 K/uL   Hemoglobin 15.5 (H) 12.0 - 15.0 g/dL   HCT 19.1 47.8 - 29.5 %   MCV 83.6 78.0 - 100.0 fl   MCHC 33.7 30.0 - 36.0 g/dL   RDW 62.1 30.8 - 65.7 %  Hemoglobin A1c     Status: Abnormal   Collection Time: 12/22/15  4:46 PM  Result Value Ref Range   Hgb A1c MFr Bld 11.1 (H) 4.6 - 6.5 %    Comment: Glycemic Control Guidelines for People with Diabetes:Non Diabetic:  <6%Goal of Therapy: <7%Additional Action Suggested:  >8%   POCT Urinalysis Dipstick     Status: None   Collection Time: 12/23/15 11:24 AM  Result Value Ref Range   Color, UA Yellow    Clarity, UA Clear    Glucose, UA 3+    Bilirubin, UA Neg    Ketones, UA 3+    Spec Grav, UA 1.025    Blood, UA Neg    pH, UA 5.5    Protein, UA Neg    Urobilinogen, UA 0.2    Nitrite, UA Neg    Leukocytes, UA Negative  Negative  POCT glucose (manual entry)     Status: Abnormal   Collection Time: 12/23/15 11:25 AM  Result Value Ref Range   POC Glucose 533 (A) 70 - 99 mg/dl    Assessment/Plan: No problem-specific assessment & plan notes found for this encounter.

## 2015-12-24 ENCOUNTER — Observation Stay (HOSPITAL_COMMUNITY)
Admission: EM | Admit: 2015-12-24 | Discharge: 2015-12-26 | Disposition: A | Discharge status: Home / Self Care | Payer: 59 | Attending: Internal Medicine | Admitting: Internal Medicine

## 2015-12-24 ENCOUNTER — Encounter (HOSPITAL_COMMUNITY): Payer: Self-pay

## 2015-12-24 ENCOUNTER — Encounter: Payer: Self-pay | Admitting: Adult Health

## 2015-12-24 DIAGNOSIS — R0789 Other chest pain: Secondary | ICD-10-CM | POA: Diagnosis not present

## 2015-12-24 DIAGNOSIS — E669 Obesity, unspecified: Secondary | ICD-10-CM | POA: Insufficient documentation

## 2015-12-24 DIAGNOSIS — E119 Type 2 diabetes mellitus without complications: Secondary | ICD-10-CM

## 2015-12-24 DIAGNOSIS — N179 Acute kidney failure, unspecified: Secondary | ICD-10-CM | POA: Insufficient documentation

## 2015-12-24 DIAGNOSIS — E860 Dehydration: Secondary | ICD-10-CM | POA: Insufficient documentation

## 2015-12-24 DIAGNOSIS — E871 Hypo-osmolality and hyponatremia: Secondary | ICD-10-CM | POA: Insufficient documentation

## 2015-12-24 DIAGNOSIS — E1310 Other specified diabetes mellitus with ketoacidosis without coma: Principal | ICD-10-CM | POA: Insufficient documentation

## 2015-12-24 DIAGNOSIS — E876 Hypokalemia: Secondary | ICD-10-CM | POA: Diagnosis not present

## 2015-12-24 DIAGNOSIS — E1010 Type 1 diabetes mellitus with ketoacidosis without coma: Secondary | ICD-10-CM

## 2015-12-24 DIAGNOSIS — Z794 Long term (current) use of insulin: Secondary | ICD-10-CM | POA: Insufficient documentation

## 2015-12-24 DIAGNOSIS — E1110 Type 2 diabetes mellitus with ketoacidosis without coma: Secondary | ICD-10-CM | POA: Diagnosis present

## 2015-12-24 HISTORY — DX: Tachycardia, unspecified: R00.0

## 2015-12-24 HISTORY — DX: Type 2 diabetes mellitus without complications: E11.9

## 2015-12-24 HISTORY — DX: Obesity, unspecified: E66.9

## 2015-12-24 HISTORY — DX: Type 2 diabetes mellitus with ketoacidosis without coma: E11.10

## 2015-12-24 LAB — URINALYSIS, ROUTINE W REFLEX MICROSCOPIC
Bilirubin Urine: NEGATIVE
Hgb urine dipstick: NEGATIVE
LEUKOCYTES UA: NEGATIVE
NITRITE: NEGATIVE
PH: 5 (ref 5.0–8.0)
Protein, ur: NEGATIVE mg/dL
Specific Gravity, Urine: 1.037 — ABNORMAL HIGH (ref 1.005–1.030)

## 2015-12-24 LAB — BASIC METABOLIC PANEL
ANION GAP: 22 — AB (ref 5–15)
ANION GAP: 18 — AB (ref 5–15)
ANION GAP: 13 (ref 5–15)
Anion gap: 8 (ref 5–15)
BUN: 8 mg/dL (ref 6–20)
BUN: 7 mg/dL (ref 6–20)
BUN: <5 mg/dL — ABNORMAL LOW (ref 6–20)
BUN: <5 mg/dL — ABNORMAL LOW (ref 6–20)
CALCIUM: 7.7 mg/dL — AB (ref 8.9–10.3)
CHLORIDE: 101 mmol/L (ref 101–111)
CHLORIDE: 106 mmol/L (ref 101–111)
CHLORIDE: 113 mmol/L — AB (ref 101–111)
CHLORIDE: 109 mmol/L (ref 101–111)
CO2: 10 mmol/L — ABNORMAL LOW (ref 22–32)
CO2: 11 mmol/L — AB (ref 22–32)
CO2: 12 mmol/L — ABNORMAL LOW (ref 22–32)
CO2: 16 mmol/L — ABNORMAL LOW (ref 22–32)
Calcium: 8.9 mg/dL (ref 8.9–10.3)
Calcium: 7.6 mg/dL — ABNORMAL LOW (ref 8.9–10.3)
Calcium: 7.8 mg/dL — ABNORMAL LOW (ref 8.9–10.3)
Creatinine, Ser: 1.17 mg/dL — ABNORMAL HIGH (ref 0.44–1.00)
Creatinine, Ser: 1.01 mg/dL — ABNORMAL HIGH (ref 0.44–1.00)
Creatinine, Ser: 0.87 mg/dL (ref 0.44–1.00)
Creatinine, Ser: 0.81 mg/dL (ref 0.44–1.00)
GLUCOSE: 324 mg/dL — AB (ref 65–99)
Glucose, Bld: 446 mg/dL — ABNORMAL HIGH (ref 65–99)
Glucose, Bld: 182 mg/dL — ABNORMAL HIGH (ref 65–99)
Glucose, Bld: 224 mg/dL — ABNORMAL HIGH (ref 65–99)
POTASSIUM: 3.8 mmol/L (ref 3.5–5.1)
POTASSIUM: 3.2 mmol/L — AB (ref 3.5–5.1)
POTASSIUM: 2.7 mmol/L — AB (ref 3.5–5.1)
Potassium: 3.9 mmol/L (ref 3.5–5.1)
SODIUM: 133 mmol/L — AB (ref 135–145)
SODIUM: 138 mmol/L (ref 135–145)
SODIUM: 133 mmol/L — AB (ref 135–145)
Sodium: 135 mmol/L (ref 135–145)

## 2015-12-24 LAB — URINE MICROSCOPIC-ADD ON

## 2015-12-24 LAB — MRSA PCR SCREENING: MRSA BY PCR: NEGATIVE

## 2015-12-24 LAB — CBC
HEMATOCRIT: 43.3 % (ref 36.0–46.0)
HEMOGLOBIN: 14.3 g/dL (ref 12.0–15.0)
MCH: 27.8 pg (ref 26.0–34.0)
MCHC: 33 g/dL (ref 30.0–36.0)
MCV: 84.1 fL (ref 78.0–100.0)
Platelets: 222 10*3/uL (ref 150–400)
RBC: 5.15 MIL/uL — AB (ref 3.87–5.11)
RDW: 12.4 % (ref 11.5–15.5)
WBC: 12.2 10*3/uL — AB (ref 4.0–10.5)

## 2015-12-24 LAB — CBG MONITORING, ED
GLUCOSE-CAPILLARY: 407 mg/dL — AB (ref 65–99)
Glucose-Capillary: 335 mg/dL — ABNORMAL HIGH (ref 65–99)

## 2015-12-24 LAB — GLUCOSE, CAPILLARY
GLUCOSE-CAPILLARY: 277 mg/dL — AB (ref 65–99)
GLUCOSE-CAPILLARY: 208 mg/dL — AB (ref 65–99)
GLUCOSE-CAPILLARY: 173 mg/dL — AB (ref 65–99)
GLUCOSE-CAPILLARY: 160 mg/dL — AB (ref 65–99)
Glucose-Capillary: 276 mg/dL — ABNORMAL HIGH (ref 65–99)
Glucose-Capillary: 267 mg/dL — ABNORMAL HIGH (ref 65–99)
Glucose-Capillary: 225 mg/dL — ABNORMAL HIGH (ref 65–99)

## 2015-12-24 MED ORDER — SODIUM CHLORIDE 0.9 % IV SOLN: INTRAVENOUS | Status: DC

## 2015-12-24 MED ORDER — DEXTROSE-NACL 5-0.45 % IV SOLN: INTRAVENOUS | Status: DC

## 2015-12-24 MED ORDER — CALCIUM CARBONATE ANTACID 500 MG PO CHEW
1.0000 | CHEWABLE_TABLET | Freq: Two times a day (BID) | ORAL | Status: DC | PRN
  Administered 2015-12-24: 200 mg via ORAL
  Filled 2015-12-24: qty 1

## 2015-12-24 MED ORDER — POTASSIUM CHLORIDE 10 MEQ/100ML IV SOLN
10.0000 meq | INTRAVENOUS | Status: AC
  Administered 2015-12-24 – 2015-12-25 (×2): 10 meq via INTRAVENOUS
  Filled 2015-12-24 (×2): qty 100

## 2015-12-24 MED ORDER — LEVONORGESTREL-ETHINYL ESTRAD 0.1-20 MG-MCG PO TABS: 1.0000 | ORAL_TABLET | Freq: Every day | ORAL | Status: DC

## 2015-12-24 MED ORDER — LIVING WELL WITH DIABETES BOOK
Freq: Once | Status: AC
  Administered 2015-12-24: 23:00:00
  Filled 2015-12-24: qty 1

## 2015-12-24 MED ORDER — SODIUM CHLORIDE 0.9 % IV BOLUS (SEPSIS)
1000.0000 mL | Freq: Once | INTRAVENOUS | Status: AC
  Administered 2015-12-24: 1000 mL via INTRAVENOUS

## 2015-12-24 MED ORDER — DEXTROSE-NACL 5-0.45 % IV SOLN
INTRAVENOUS | Status: DC
  Administered 2015-12-24 – 2015-12-25 (×3): via INTRAVENOUS

## 2015-12-24 MED ORDER — POTASSIUM CHLORIDE 10 MEQ/100ML IV SOLN
10.0000 meq | INTRAVENOUS | Status: AC
  Administered 2015-12-24 (×2): 10 meq via INTRAVENOUS
  Filled 2015-12-24 (×2): qty 100

## 2015-12-24 MED ORDER — POTASSIUM CHLORIDE 10 MEQ/100ML IV SOLN
10.0000 meq | INTRAVENOUS | Status: AC
Start: 2015-12-24 — End: 2015-12-24

## 2015-12-24 MED ORDER — SODIUM CHLORIDE 0.9 % IV BOLUS (SEPSIS)
1000.0000 mL | Freq: Once | INTRAVENOUS | Status: AC
Start: 2015-12-24 — End: 2015-12-24
  Administered 2015-12-24: 1000 mL via INTRAVENOUS

## 2015-12-24 MED ORDER — LORATADINE 10 MG PO TABS
10.0000 mg | ORAL_TABLET | Freq: Every day | ORAL | Status: DC
  Administered 2015-12-24 – 2015-12-26 (×3): 10 mg via ORAL
  Filled 2015-12-24 (×3): qty 1

## 2015-12-24 MED ORDER — SODIUM CHLORIDE 0.9 % IV SOLN
INTRAVENOUS | Status: DC
  Administered 2015-12-24: 2.8 [IU]/h via INTRAVENOUS
  Filled 2015-12-24: qty 2.5

## 2015-12-24 MED ORDER — ENOXAPARIN SODIUM 40 MG/0.4ML ~~LOC~~ SOLN
40.0000 mg | SUBCUTANEOUS | Status: DC
  Administered 2015-12-24 – 2015-12-25 (×2): 40 mg via SUBCUTANEOUS
  Filled 2015-12-24 (×3): qty 0.4

## 2015-12-24 MED ORDER — POTASSIUM CHLORIDE 10 MEQ/50ML IV SOLN: 10.0000 meq | INTRAVENOUS | Status: DC

## 2015-12-24 NOTE — ED Notes (Addendum)
Patient reports that she was newly diagnosed with diabetes yesterday and started on insulin. Took 5 units of insulin this am and complains of fatigue. Awoke from nap and states that BS greater than 500. Alert and oriented, no distress

## 2015-12-24 NOTE — ED Provider Notes (Signed)
CSN: [id number]     Arrival date & time 12/24/15  1207 History   First MD Initiated Contact with Patient 12/24/15 1218     Chief Complaint  Patient presents with  . Hyperglycemia     (Consider location/radiation/quality/duration/timing/severity/associated sxs/prior Treatment) HPI Comments: Patient presents today with a chief complaint of hyperglycemia.  She states that she was diagnosed yesterday with Type I DM by her PCP and was started on Basaglar insulin daily.  She states that she last took her insulin around 9 AM this morning.  Today she checked her blood sugar and it was over 500.  She states that she was told by her PCP to come to the ED if her sugar was over 500, which prompted her to come in today.  She has not taken any additional insulin prior to coming in today.  She denies nausea, vomiting, abdominal pain, SOB, fever, chills, or any other symptoms at this time.    The history is provided by the patient.    Past Medical History  Diagnosis Date  . Chicken pox   . UTI (urinary tract infection)   . Diabetes mellitus without complication (HCC)    History reviewed. No pertinent past surgical history. Family History  Problem Relation Age of Onset  . Thyroid disease Maternal Grandmother   . Hyperlipidemia Paternal Grandmother   . Hyperlipidemia Paternal Grandfather   . Lung cancer Paternal Grandfather   . Arthritis Paternal Grandmother   . Thyroid disease Mother   . Breast cancer Maternal Aunt   . Lung cancer Maternal Aunt   . Stroke      maternal great grandmother  . Hypertension      maternal great grandmother  . Diabetes Other     Maternal great grandmother  . Diabetes Paternal Grandmother    Social History  Substance Use Topics  . Smoking status: Never Smoker   . Smokeless tobacco: None  . Alcohol Use: 0.0 oz/week    0 Standard drinks or equivalent per week     Comment: socially; once a month    OB History    No data available     Review of Systems  All  other systems reviewed and are negative.     Allergies  Review of patient's allergies indicates no known allergies.  Home Medications   Prior to Admission medications   Medication Sig Start Date End Date Taking? Authorizing Provider  cetirizine (ZYRTEC) 10 MG tablet Take 10 mg by mouth daily.   Yes Historical Provider, MD  levonorgestrel-ethinyl estradiol (AVIANE,ALESSE,LESSINA) 0.1-20 MG-MCG tablet Take 1 tablet by mouth daily.   Yes Historical Provider, MD  ACCU-CHEK SOFTCLIX LANCETS lancets Use as instructed Patient not taking: Reported on 12/24/2015 12/23/15   Shirline Freesory Nafziger, NP  glucose blood (ACCU-CHEK AVIVA PLUS) test strip Use as instructed 12/23/15   Shirline Freesory Nafziger, NP  insulin aspart (NOVOLOG FLEXPEN) 100 UNIT/ML FlexPen Inject 4 Units into the skin 3 (three) times daily with meals. 12/26/15   Catarina Hartshornavid Tat, MD  Insulin Glargine (BASAGLAR KWIKPEN) 100 UNIT/ML SOPN Inject 0.35 mLs (35 Units total) into the skin daily. 12/26/15   Catarina Hartshornavid Tat, MD  Sharps Container (BD SHARPS COLLECTOR) MISC Use container to dispose of sharps 12/23/15   Shirline Freesory Nafziger, NP   BP 140/92 mmHg  Pulse 105  Temp(Src) 99.2 F (37.3 C) (Oral)  Resp 18  SpO2 100%  LMP 12/09/2015 Physical Exam  Constitutional: She appears well-developed and well-nourished.  HENT:  Head: Normocephalic and atraumatic.  Mouth/Throat:  Oropharynx is clear and moist.  Neck: Normal range of motion. Neck supple.  Cardiovascular: Normal rate, regular rhythm and normal heart sounds.   Pulmonary/Chest: Effort normal and breath sounds normal.  Abdominal: Soft. Bowel sounds are normal. She exhibits no distension and no mass. There is no tenderness. There is no rebound and no guarding.  Musculoskeletal: Normal range of motion.  Neurological: She is alert.  Skin: Skin is warm and dry.  Psychiatric: She has a normal mood and affect.  Nursing note and vitals reviewed.   ED Course  Procedures (including critical care time) Labs Review Labs  Reviewed  CBG MONITORING, ED - Abnormal; Notable for the following:    Glucose-Capillary 407 (*)    All other components within normal limits  BASIC METABOLIC PANEL  CBC  URINALYSIS, ROUTINE W REFLEX MICROSCOPIC (NOT AT [facility])  CBG MONITORING, ED    Imaging Review No results found. I have personally reviewed and evaluated these images and lab results as part of my medical decision-making.   EKG Interpretation None      MDM   Final diagnoses:  None   Patient presents today with a chief complaint of hyperglycemia.  She was just diagnosed with Type I DM yesterday and started on insulin.  Today her blood sugar was elevated at 407.  She also has an anion gap of 22 and bicarb of 10.  Patient given IVF and started on the glucostabilizer.  Patient admitted to [facility].      Santiago Glad, PA-C 12/27/15 4098  Glynn Octave, MD 12/27/15 [phone]

## 2015-12-24 NOTE — ED Notes (Signed)
Attempted report 

## 2015-12-24 NOTE — H&P (Signed)
[facility] History and Physical  JUNETTA HEARN ZOX:[id number] DOB: [birth year] DOA: 12/24/2015  Referring physician: Jaci Lazier PCP: Shirline Frees, NP   Chief Complaint: fatigue  HPI: Tammy Richards is a very pleasant 25 y.o. female with a past medical history that includes newly diagnosed type 1 diabetes history emergency department with a chief complaint generalized weakness and lethargy combined with a blood sugar greater than 500.  Formation is obtained from the patient and her mother who is at the bedside. She reports she was diagnosed yesterday with type 1 diabetes. She was prescribed 5 units basaglar and instructed to check her blood sugar 4 times a day. He was instructed that if her blood sugar stayed in the 300 she could increase to 10 units. She states yesterday morning her blood sugar was 268 so she gave herself 5 units. Throughout the course of the day her blood sugar trended up and at bedtime blood sugar was 470. She has not given herself any further insulin. This morning when she awakened blood sugar was greater than 500. Associated symptoms include intermittent nausea without emesis. She denies chest pain palpitations abdominal pain dysuria hematuria frequency or urgency. She denies headache dizziness syncope or near-syncope. She denies fever chills cough lower extremity edema.  In emergency department she's afebrile hemodynamically stable slightly tachycardic and not hypoxic. She is provided with 3 L of normal saline and insulin drip via Glucomander initiated   Review of Systems:  10 point review of systems complete and all systems are negative except as indicated in the history of present illness  Past Medical History  Diagnosis Date  . Chicken pox   . UTI (urinary tract infection)   . Diabetes mellitus without complication (HCC)     new dx 11/2015  . DKA (diabetic ketoacidoses) (HCC)     11/2015  . Tachycardia    History reviewed. No pertinent past surgical  history. Social History:  reports that she has never smoked. She does not have any smokeless tobacco history on file. She reports that she drinks alcohol. She reports that she does not use illicit drugs. She lives at home. 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 She is independent with ADLs. No Known Allergies  Family History  Problem Relation Age of Onset  . Thyroid disease Maternal Grandmother   . Hyperlipidemia Paternal Grandmother   . Hyperlipidemia Paternal Grandfather   . Lung cancer Paternal Grandfather   . Arthritis Paternal Grandmother   . Thyroid disease Mother   . Breast cancer Maternal Aunt   . Lung cancer Maternal Aunt   . Stroke      maternal great grandmother  . Hypertension      maternal great grandmother  . Diabetes Other     Maternal great grandmother  . Diabetes Paternal Grandmother     Prior to Admission medications   Medication Sig Start Date End Date Taking? Authorizing Provider  ACCU-CHEK SOFTCLIX LANCETS lancets Use as instructed 12/23/15   Shirline Frees, NP  cetirizine (ZYRTEC) 10 MG tablet Take 10 mg by mouth daily.    Historical Provider, MD  glucose blood (ACCU-CHEK AVIVA PLUS) test strip Use as instructed 12/23/15   Shirline Frees, NP  levonorgestrel-ethinyl estradiol (AVIANE,ALESSE,LESSINA) 0.1-20 MG-MCG tablet Take 1 tablet by mouth daily.    Historical Provider, MD  ondansetron (ZOFRAN) 4 MG tablet Take 1 tablet (4 mg total) by mouth every 8 (eight) hours as needed for nausea or vomiting. 12/22/15  Shirline Frees, NP  Transport planner (BD SHARPS COLLECTOR) MISC Use container to dispose of sharps 12/23/15   Shirline Frees, NP   Physical Exam: Filed Vitals:   12/24/15 1245 12/24/15 1300 12/24/15 1315 12/24/15 1330  BP: 143/94 133/91 133/80 126/92  Pulse: 96 92 91 88  Temp:      TempSrc:      Resp:    18  SpO2: 100% 100% 100% 100%    Wt Readings from Last 3 Encounters:  12/23/15 82.736 kg (182 lb 6.4 oz)    12/22/15 83.553 kg (184 lb 3.2 oz)  02/03/15 84.687 kg (186 lb 11.2 oz)    General:  Appears calm and comfortable, smiling Eyes: PERRL, normal lids, irises & conjunctiva ENT: grossly normal hearing, lips & tongue, his membranes of her mouth are dry but pink Neck: no LAD, masses or thyromegaly Cardiovascular: RRR, no m/r/g. No LE edema.  Respiratory: CTA bilaterally, no w/r/r. Normal respiratory effort. Abdomen: soft, ntnd obese positive bowel sounds throughout no guarding or rebounding Skin: no rash or induration seen on limited exam Musculoskeletal: grossly normal tone BUE/BLE, joints without swelling/erythema Psychiatric: grossly normal mood and affect, speech fluent and appropriate Neurologic: grossly non-focal. Speech clear facial symmetry           Labs on Admission:  Basic Metabolic Panel:  Recent Labs Lab 12/22/15 0818 12/24/15 1216  NA 136 133*  K 3.9 3.8  CL 101 101  CO2 18* 10*  GLUCOSE 337* 446*  BUN 13 8  CREATININE 0.94 1.17*  CALCIUM 10.1 8.9   Liver Function Tests:  Recent Labs Lab 12/22/15 0818  AST 20  ALT 19  ALKPHOS 88  BILITOT 0.6  PROT 8.5*  ALBUMIN 4.8   No results for input(s): LIPASE, AMYLASE in the last 168 hours. No results for input(s): AMMONIA in the last 168 hours. CBC:  Recent Labs Lab 12/22/15 0818 12/24/15 1216  WBC 7.8 12.2*  HGB 15.5* 14.3  HCT 46.0 43.3  MCV 83.6 84.1  PLT 226.0 222   Cardiac Enzymes: No results for input(s): CKTOTAL, CKMB, CKMBINDEX, TROPONINI in the last 168 hours.  BNP (last 3 results) No results for input(s): BNP in the last 8760 hours.  ProBNP (last 3 results) No results for input(s): PROBNP in the last 8760 hours.  CBG:  Recent Labs Lab 12/24/15 1212 12/24/15 1347  GLUCAP 407* 335*    Radiological Exams on Admission: No results found.  EKG:   Assessment/Plan Principal Problem:   DKA (diabetic ketoacidoses) (HCC) Active Problems:   Diabetes (HCC)   Hyponatremia   Acute  kidney injury (HCC)  #1. DKA. Serum glucose 446 on admission. Anion gap 22. Diagnosed type 1 diabetes.  -Admit to step down -Insulin drip per protocol -Vigorous IV fluids -2 runs of potassium per protocol -We'll transition to long-acting per protocol -Diabetes coordinator consult  #2. Acute kidney injury. Mild. Creatinine 1.17 on admission. Likely related to dehydration in the setting of #1. -Continue vigorous IV fluids as noted above -Hold any nephrotoxins -Monitor urine output -recheck in the am  3. Hyponatremia. Mild. Related to #1. Anticipate resolution with fluid resuscitation and insulin therapy -Monitor per protocol  #4. Diabetes type 1. Diagnosed 2 days ago. Review indicates hemoglobin A1c 11.1. Initial prescription for 5 units of Basaglar with QID cbg checks. Patient reports yesterday morning blood sugar 268 and trended up all day. Bedtime CBG reportedly 470 last evening.  -See #1 -We'll request diabetes coordinator consult -We'll ask nursing staff to  verify patient can accurately monitor CBGs -Likely transition to a higher dose long-acting insulin once off of drip -Consider sliding scale as well   Code Status: full DVT Prophylaxis: Family Communication: mother at bedside Disposition Plan: home hopefully 24-36 hours  Time spent: 55 minutes  [facility] Rehabilitation HospitalBLACK,Charnita Trudel M [facility]

## 2015-12-25 DIAGNOSIS — E669 Obesity, unspecified: Secondary | ICD-10-CM | POA: Diagnosis not present

## 2015-12-25 DIAGNOSIS — Z794 Long term (current) use of insulin: Secondary | ICD-10-CM | POA: Diagnosis not present

## 2015-12-25 DIAGNOSIS — N179 Acute kidney failure, unspecified: Secondary | ICD-10-CM | POA: Diagnosis not present

## 2015-12-25 DIAGNOSIS — E871 Hypo-osmolality and hyponatremia: Secondary | ICD-10-CM | POA: Diagnosis not present

## 2015-12-25 DIAGNOSIS — E1310 Other specified diabetes mellitus with ketoacidosis without coma: Secondary | ICD-10-CM | POA: Diagnosis not present

## 2015-12-25 DIAGNOSIS — E860 Dehydration: Secondary | ICD-10-CM

## 2015-12-25 DIAGNOSIS — E0810 Diabetes mellitus due to underlying condition with ketoacidosis without coma: Secondary | ICD-10-CM | POA: Diagnosis not present

## 2015-12-25 DIAGNOSIS — E876 Hypokalemia: Secondary | ICD-10-CM | POA: Diagnosis not present

## 2015-12-25 LAB — GLUCOSE, CAPILLARY
GLUCOSE-CAPILLARY: 153 mg/dL — AB (ref 65–99)
GLUCOSE-CAPILLARY: 134 mg/dL — AB (ref 65–99)
GLUCOSE-CAPILLARY: 161 mg/dL — AB (ref 65–99)
GLUCOSE-CAPILLARY: 270 mg/dL — AB (ref 65–99)
Glucose-Capillary: 120 mg/dL — ABNORMAL HIGH (ref 65–99)
Glucose-Capillary: 130 mg/dL — ABNORMAL HIGH (ref 65–99)
Glucose-Capillary: 136 mg/dL — ABNORMAL HIGH (ref 65–99)
Glucose-Capillary: 143 mg/dL — ABNORMAL HIGH (ref 65–99)
Glucose-Capillary: 148 mg/dL — ABNORMAL HIGH (ref 65–99)
Glucose-Capillary: 221 mg/dL — ABNORMAL HIGH (ref 65–99)
Glucose-Capillary: 223 mg/dL — ABNORMAL HIGH (ref 65–99)
Glucose-Capillary: 284 mg/dL — ABNORMAL HIGH (ref 65–99)
Glucose-Capillary: 292 mg/dL — ABNORMAL HIGH (ref 65–99)

## 2015-12-25 LAB — BASIC METABOLIC PANEL
ANION GAP: 10 (ref 5–15)
ANION GAP: 9 (ref 5–15)
BUN: <5 mg/dL — ABNORMAL LOW (ref 6–20)
BUN: <5 mg/dL — ABNORMAL LOW (ref 6–20)
CALCIUM: 8.3 mg/dL — AB (ref 8.9–10.3)
CHLORIDE: 108 mmol/L (ref 101–111)
CHLORIDE: 108 mmol/L (ref 101–111)
CO2: 17 mmol/L — AB (ref 22–32)
CO2: 19 mmol/L — AB (ref 22–32)
Calcium: 8.4 mg/dL — ABNORMAL LOW (ref 8.9–10.3)
Creatinine, Ser: 0.72 mg/dL (ref 0.44–1.00)
Creatinine, Ser: 0.72 mg/dL (ref 0.44–1.00)
GFR calc Af Amer: >60 mL/min (ref >60)
GFR calc non Af Amer: >60 mL/min (ref >60)
GFR calc non Af Amer: >60 mL/min (ref >60)
GLUCOSE: 136 mg/dL — AB (ref 65–99)
GLUCOSE: 143 mg/dL — AB (ref 65–99)
POTASSIUM: 2.8 mmol/L — AB (ref 3.5–5.1)
Potassium: 2.8 mmol/L — ABNORMAL LOW (ref 3.5–5.1)
Sodium: 135 mmol/L (ref 135–145)
Sodium: 136 mmol/L (ref 135–145)

## 2015-12-25 LAB — MAGNESIUM: MAGNESIUM: 1.5 mg/dL — AB (ref 1.7–2.4)

## 2015-12-25 LAB — TROPONIN I: Troponin I: 0.03 ng/mL (ref <0.031)

## 2015-12-25 MED ORDER — MENTHOL 3 MG MT LOZG
1.0000 | LOZENGE | OROMUCOSAL | Status: DC | PRN
  Filled 2015-12-25: qty 9

## 2015-12-25 MED ORDER — INSULIN ASPART 100 UNIT/ML ~~LOC~~ SOLN
0.0000 [IU] | Freq: Three times a day (TID) | SUBCUTANEOUS | Status: DC
  Administered 2015-12-25: 5 [IU] via SUBCUTANEOUS
  Administered 2015-12-25: 8 [IU] via SUBCUTANEOUS
  Administered 2015-12-26: 5 [IU] via SUBCUTANEOUS
  Administered 2015-12-26: 8 [IU] via SUBCUTANEOUS

## 2015-12-25 MED ORDER — MAGNESIUM SULFATE 2 GM/50ML IV SOLN
2.0000 g | Freq: Once | INTRAVENOUS | Status: AC
  Administered 2015-12-25: 2 g via INTRAVENOUS
  Filled 2015-12-25: qty 50

## 2015-12-25 MED ORDER — POTASSIUM CHLORIDE 10 MEQ/100ML IV SOLN
10.0000 meq | INTRAVENOUS | Status: AC
Start: 2015-12-25 — End: 2015-12-25
  Administered 2015-12-25 (×2): 10 meq via INTRAVENOUS
  Filled 2015-12-25 (×2): qty 100

## 2015-12-25 MED ORDER — SODIUM CHLORIDE 0.9 % IV SOLN
INTRAVENOUS | Status: DC
  Administered 2015-12-25 – 2015-12-26 (×2): via INTRAVENOUS
  Filled 2015-12-25 (×4): qty 1000

## 2015-12-25 MED ORDER — INSULIN ASPART 100 UNIT/ML ~~LOC~~ SOLN
0.0000 [IU] | Freq: Every day | SUBCUTANEOUS | Status: DC
  Administered 2015-12-25: 3 [IU] via SUBCUTANEOUS

## 2015-12-25 MED ORDER — INSULIN GLARGINE 100 UNIT/ML ~~LOC~~ SOLN
25.0000 [IU] | Freq: Every day | SUBCUTANEOUS | Status: DC
  Administered 2015-12-25 – 2015-12-26 (×2): 25 [IU] via SUBCUTANEOUS
  Filled 2015-12-25 (×2): qty 0.25

## 2015-12-25 MED ORDER — POTASSIUM CHLORIDE CRYS ER 20 MEQ PO TBCR
40.0000 meq | EXTENDED_RELEASE_TABLET | Freq: Once | ORAL | Status: AC
  Administered 2015-12-25: 40 meq via ORAL
  Filled 2015-12-25: qty 2

## 2015-12-25 NOTE — Progress Notes (Signed)
Inpatient Diabetes Program Recommendations  AACE/ADA: New Consensus Statement on Inpatient Glycemic Control (2015)  Target Ranges:  Prepandial:   less than 140 mg/dL      Peak postprandial:   less than 180 mg/dL (1-2 hours)      Critically ill patients:  140 - 180 mg/dL   Review of Glycemic Control  Diabetes history: Newly-diagnosed Type 1 Outpatient Diabetes medications: Basaglar 5 units QD - Increase to 10 units QD Current orders for Inpatient glycemic control: Lantus 25 units QD, Novolog moderate tidwc and hs  Results for [name], Tammy Richards (MRN [id number]) as of 12/25/2015 21:46  Ref. Range 12/25/2015 06:49 12/25/2015 08:35 12/25/2015 09:45 12/25/2015 11:45 12/25/2015 16:25  Glucose-Capillary Latest Ref Range: 65-99 mg/dL 161 (H) 221 (H) 284 (H) 223 (H) 270 (H)  Results for [name], [name] (MRN [id number]) as of 12/25/2015 21:46  Ref. Range 12/22/2015 16:46  Hemoglobin A1C Latest Ref Range: 4.6-6.5 % 11.1 (H)  Results for [name], [name] (MRN [id number]) as of 12/25/2015 21:46  Ref. Range 12/25/2015 05:49  Sodium Latest Ref Range: 135-145 mmol/L 136  Potassium Latest Ref Range: 3.5-5.1 mmol/L 2.8 (L)  Chloride Latest Ref Range: 101-111 mmol/L 108  CO2 Latest Ref Range: 22-32 mmol/L 19 (L)  BUN Latest Ref Range: 6-20 mg/dL <5 (L)  Creatinine Latest Ref Range: 0.44-1.00 mg/dL 0.72  Calcium Latest Ref Range: 8.9-10.3 mg/dL 8.4 (L)  EGFR (Non-African Amer.) Latest Ref Range: >60 mL/min >60  EGFR (African American) Latest Ref Range: >60 mL/min >60  Glucose Latest Ref Range: 65-99 mg/dL 143 (H)  Anion gap Latest Ref Range: 5-15  9   Newly-diagnosed DM1 presents with nausea and vomiting - found to be in DKA. GlucoStabilizer started and pt was transitioned to SQ when criteria met for discontinuation. Needs insulin adjustment.  Inpatient Diabetes Program Recommendations:    On Lantus 25 units QD Add Novolog 4 units tidwc for meal coverage insulin.  Will need prescription for rapid-acting  insulin for home. PCP only gave prescription for basal insulin - Basaglar - 10 units QD. Will order OP Diabetes Education consult for newly-diagnosed DM1. F/U with PCP within 1 week of discharge.  Will continue to follow. Thank you. Lorenda Peck, RD, LDN, CDE Inpatient Diabetes Coordinator [phone]

## 2015-12-25 NOTE — Progress Notes (Signed)
Worked with pt and family on injections of insulin based on sliding scale.  Pt demonstrated correct technique of administering insulin into right lower abdomen.

## 2015-12-25 NOTE — Progress Notes (Signed)
PROGRESS NOTE  Tammy Richards:[id number] DOB: Apr 02, 1991 DOA: 12/24/2015 PCP: Shirline Frees, NP  Brief History:  25 year old female with newly diagnosed diabetes mellitus presented with one-day history of generalized weakness, lethargy, and CBGs greater than 500. The patient was noted to have anion gap of 22 with serum glucose of 446. She was treated with intravenous insulin and intravenous fluids for DKA.  The patient saw her primary care provider, Nafziger, NP, on 12/23/2015 with complaints of polydipsia and polyuria for 2 weeks.  She also complained of nausea and fatigue for the period of time. The patient was given Zofran at that time. The patient was given glucose test strips and glucometer. She represented to her primary care provider's office the following day to follow-up on her blood tests. Apparently, the patient had a hemoglobin A1c of 11.3 with CBG of 337.  She was started on Basaglar pen and given instructions on using the pen during the office visit. She was initially given 5 units during the office visit and took the pen home. Throughout the day, the patient's CBGs trended up and was 470 at bedtime on 12/23/2015. When she woke up the morning of 12/24/2015, CBG was >500 prompting her to come to the emergency department.  Assessment/Plan: DKA -Presented with serum glucose 446 with anion gap 22 -Patient treated with aggressive fluid resuscitation and intravenous insulin -Anion gap is closed --> transition to subcutaneous insulin -start lantus 25 units  Dehydration  -Secondary to DKA -Resolved   Insulin-dependent diabetes mellitus, uncontrolled -Anti-GAD antibody -Anti-insulin antibody -C peptide -anti-islet cell antibody -12/22/2015 hemoglobin A1c 11.1  Pseudohyponatremia  -Secondary to elevated serum glucose  -Resolved   Hypokalemia  -Repleted  -check mag  Atypical chest pain -cycle troponin -EKG  Disposition Plan:   Home in 5/29 if stable Family  Communication:   Mother updated at beside  Consultants:  none  Code Status:  FULL    Subjective: Patient complains of sore throat when she   first wakes up in the morning. She also complains of some discomfort with swallowing Jell-O. Denies any fevers, chills, vomiting, diarrhea, abdominal pain, dysuria, hematuria, headache, neck pain.  Objective: Filed Vitals:   12/24/15 1623 12/24/15 1922 12/24/15 2322 12/25/15 0334  BP: 121/96 123/70 115/77 121/76  Pulse: 96 95 78 88  Temp: 98.6 F (37 C) 98.5 F (36.9 C) 98.4 F (36.9 C) 98.1 F (36.7 C)  TempSrc: Oral Oral Oral Oral  Resp: Height:      Weight:      SpO2: 100% 100% 100% 100%    Intake/Output Summary (Last 24 hours) at 12/25/15 0919 Last data filed at 12/25/15 0650  Gross per 24 hour  Intake  512.5 ml  Output   4825 ml  Net -4312.5 ml   Weight change:  Exam:   General:  Pt is alert, follows commands appropriately, not in acute distress  HEENT: No icterus, No thrush, No neck mass, [place]/AT  Cardiovascular: RRR, S1/S2, no rubs, no gallops  Respiratory: CTA bilaterally, no wheezing, no crackles, no rhonchi  Abdomen: Soft/+BS, non tender, non distended, no guarding  Extremities: trace edema, No lymphangitis, No petechiae, No rashes, no synovitis   Data Reviewed: I have personally reviewed following labs and imaging studies Basic Metabolic Panel:  Recent Labs Lab 12/24/15 1354 12/24/15 1658 12/24/15 2131 12/25/15 0117 12/25/15 0549  NA 135 138 133* 135 136  K 3.9 3.2* 2.7* 2.8* 2.8*  CL 106 113* 109 108 108  CO2 11* 12* 16* 17* 19*  GLUCOSE 324* 182* 224* 136* 143*  BUN 7 <5* <5* <5* <5*  CREATININE 1.01* 0.87 0.81 0.72 0.72  CALCIUM 7.7* 7.6* 7.8* 8.3* 8.4*   Liver Function Tests:  Recent Labs Lab 12/22/15 0818  AST 20  ALT 19  ALKPHOS 88  BILITOT 0.6  PROT 8.5*  ALBUMIN 4.8   No results for input(s): LIPASE, AMYLASE in the last 168 hours. No results for input(s): AMMONIA  in the last 168 hours. Coagulation Profile: No results for input(s): INR, PROTIME in the last 168 hours. CBC:  Recent Labs Lab 12/22/15 0818 12/24/15 1216  WBC 7.8 12.2*  HGB 15.5* 14.3  HCT 46.0 43.3  MCV 83.6 84.1  PLT 226.0 222   Cardiac Enzymes: No results for input(s): CKTOTAL, CKMB, CKMBINDEX, TROPONINI in the last 168 hours. BNP: Invalid input(s): POCBNP CBG:  Recent Labs Lab 12/25/15 0248 12/25/15 0332 12/25/15 0449 12/25/15 0555 12/25/15 0649  GLUCAP 136* 134* 120* 130* 161*   HbA1C:  Recent Labs  12/22/15 1646  HGBA1C 11.1*   Urine analysis:    Component Value Date/Time   COLORURINE YELLOW 12/24/2015 1230   APPEARANCEUR CLEAR 12/24/2015 1230   LABSPEC 1.037* 12/24/2015 1230   PHURINE 5.0 12/24/2015 1230   GLUCOSEU >1000* 12/24/2015 1230   HGBUR NEGATIVE 12/24/2015 1230   BILIRUBINUR NEGATIVE 12/24/2015 1230   BILIRUBINUR Neg 12/23/2015 1124   KETONESUR >80* 12/24/2015 1230   PROTEINUR NEGATIVE 12/24/2015 1230   PROTEINUR Neg 12/23/2015 1124   UROBILINOGEN 0.2 12/23/2015 1124   NITRITE NEGATIVE 12/24/2015 1230   NITRITE Neg 12/23/2015 1124   LEUKOCYTESUR NEGATIVE 12/24/2015 1230   Sepsis Labs: @LABRCNTIP (procalcitonin:4,lacticidven:4) ) Recent Results (from the past 240 hour(s))  MRSA PCR Screening     Status: None   Collection Time: 12/24/15  5:16 PM  Result Value Ref Range Status   MRSA by PCR NEGATIVE NEGATIVE Final    Comment:        The GeneXpert MRSA Assay (FDA approved for NASAL specimens only), is one component of a comprehensive MRSA colonization surveillance program. It is not intended to diagnose MRSA infection nor to guide or monitor treatment for MRSA infections.      Scheduled Meds: . enoxaparin (LOVENOX) injection  40 mg Subcutaneous Q24H  . levonorgestrel-ethinyl estradiol  1 tablet Oral Daily  . loratadine  10 mg Oral Daily  . potassium chloride  10 mEq Intravenous Q1 Hr x 2  . potassium chloride  40 mEq Oral  Once   Continuous Infusions: . sodium chloride Stopped (12/24/15 1931)  . sodium chloride Stopped (12/24/15 1931)  . dextrose 5 % and 0.45% NaCl 75 mL/hr at 12/25/15 0646  . insulin (NOVOLIN-R) infusion 2.8 Units/hr (12/25/15 0650)    Procedures/Studies: No results found.  D'Arcy Abraha, DO  [facility] Pager [phone]  If 7PM-7AM, please contact night-coverage [email or web address] Password [email or web address] 12/25/2015, 9:19 AM

## 2015-12-26 DIAGNOSIS — E669 Obesity, unspecified: Secondary | ICD-10-CM | POA: Diagnosis not present

## 2015-12-26 DIAGNOSIS — E871 Hypo-osmolality and hyponatremia: Secondary | ICD-10-CM | POA: Diagnosis not present

## 2015-12-26 DIAGNOSIS — N179 Acute kidney failure, unspecified: Secondary | ICD-10-CM | POA: Diagnosis not present

## 2015-12-26 DIAGNOSIS — E0810 Diabetes mellitus due to underlying condition with ketoacidosis without coma: Secondary | ICD-10-CM | POA: Diagnosis not present

## 2015-12-26 LAB — BASIC METABOLIC PANEL
ANION GAP: 9 (ref 5–15)
BUN: <5 mg/dL — ABNORMAL LOW (ref 6–20)
CALCIUM: 8.4 mg/dL — AB (ref 8.9–10.3)
CHLORIDE: 108 mmol/L (ref 101–111)
CO2: 19 mmol/L — AB (ref 22–32)
Creatinine, Ser: 0.68 mg/dL (ref 0.44–1.00)
GFR calc non Af Amer: >60 mL/min (ref >60)
Glucose, Bld: 243 mg/dL — ABNORMAL HIGH (ref 65–99)
POTASSIUM: 3.2 mmol/L — AB (ref 3.5–5.1)
Sodium: 136 mmol/L (ref 135–145)

## 2015-12-26 LAB — GLUCOSE, CAPILLARY
GLUCOSE-CAPILLARY: 232 mg/dL — AB (ref 65–99)
GLUCOSE-CAPILLARY: 276 mg/dL — AB (ref 65–99)

## 2015-12-26 LAB — MAGNESIUM: Magnesium: 1.9 mg/dL (ref 1.7–2.4)

## 2015-12-26 LAB — ANTI-ISLET CELL ANTIBODY: Pancreatic Islet Cell Antibody: NEGATIVE

## 2015-12-26 MED ORDER — BASAGLAR KWIKPEN 100 UNIT/ML ~~LOC~~ SOPN: 35.0000 [IU] | PEN_INJECTOR | Freq: Every day | SUBCUTANEOUS | Status: AC

## 2015-12-26 MED ORDER — INSULIN ASPART 100 UNIT/ML FLEXPEN: 4.0000 [IU] | PEN_INJECTOR | Freq: Three times a day (TID) | SUBCUTANEOUS | Status: AC

## 2015-12-26 NOTE — Discharge Summary (Signed)
Physician Discharge Summary  Hiram ComberRashonda K Lewis ZOX:[id number] DOB: 04/30/1991 DOA: 12/24/2015  PCP: Shirline Freesory Nafziger, NP  Admit date: 12/24/2015 Discharge date: 12/26/2015  Admitted From: HOME Disposition:  HOME  Recommendations for Outpatient Follow-up:  1. Follow up with PCP in 1-2 weeks 2. Please obtain BMP in one week 3. Please follow up with Anti-GAD antibody, Anti-insulin antibody, C peptide, Anti-islet cell antibody   Home Health:NO Equipment/Devices:NONE  Discharge Condition:Stable CODE STATUS:FULL Diet recommendation: Carb modified  Brief/Interim Summary 25 year old female with newly diagnosed diabetes mellitus presented with one-day history of generalized weakness, lethargy, and CBGs greater than 500. The patient was noted to have anion gap of 22 with serum glucose of 446. She was treated with intravenous insulin and intravenous fluids for DKA. The patient saw her primary care provider, Nafziger, NP, on 12/23/2015 with complaints of polydipsia and polyuria for 2 weeks. She also complained of nausea and fatigue for the period of time. The patient was given Zofran at that time. The patient was given glucose test strips and glucometer. She represented to her primary care provider's office the following day to follow-up on her blood tests. Apparently, the patient had a hemoglobin A1c of 11.3 with CBG of 337. She was started on Basaglar pen and given instructions on using the pen during the office visit. She was initially given 5 units during the office visit and took the pen home. Throughout the day, the patient's CBGs trended up and was 470 at bedtime on 12/23/2015. When she woke up the morning of 12/24/2015, CBG was >500 prompting her to come to the emergency department.   Discharge Diagnoses:  DKA -Presented with serum glucose 446 with anion gap 22 -Patient treated with aggressive fluid resuscitation and intravenous insulin -Anion gap is closed --> transition to subcutaneous  insulin -started lantus 25 units -home with Basaglar 35 units daily -home novolog 4 units with meals  Dehydration  -Secondary to DKA -Resolved   Insulin-dependent diabetes mellitus, uncontrolled -Anti-GAD antibody -Anti-insulin antibody -C peptide -anti-islet cell antibody -12/22/2015 hemoglobin A1c 11.1  Pseudohyponatremia  -Secondary to elevated serum glucose  -Resolved   Hypokalemia  -Repleted  -check mag  Atypical chest pain -cycle troponin -EKG   Discharge Instructions  Discharge Instructions    Amb Referral to Nutrition and Diabetic E    Complete by:  As directed      Ambulatory referral to Nutrition and Diabetic Education    Complete by:  As directed      Diet - low sodium heart healthy    Complete by:  As directed      Increase activity slowly    Complete by:  As directed             Medication List    TAKE these medications        ACCU-CHEK SOFTCLIX LANCETS lancets  Use as instructed     BASAGLAR KWIKPEN 100 UNIT/ML Sopn  Inject 0.35 mLs (35 Units total) into the skin daily.     BD SHARPS COLLECTOR Misc  Use container to dispose of sharps     cetirizine 10 MG tablet  Commonly known as:  ZYRTEC  Take 10 mg by mouth daily.     glucose blood test strip  Commonly known as:  ACCU-CHEK AVIVA PLUS  Use as instructed     insulin aspart 100 UNIT/ML FlexPen  Commonly known as:  NOVOLOG FLEXPEN  Inject 4 Units into the skin 3 (three) times daily with meals.     levonorgestrel-ethinyl  estradiol 0.1-20 MG-MCG tablet  Commonly known as:  AVIANE,ALESSE,LESSINA  Take 1 tablet by mouth daily.        No Known Allergies  Consultations:  None   Procedures/Studies:  No results found.      Discharge Exam: Filed Vitals:   12/26/15 0147 12/26/15 0515  BP: 106/67 116/71  Pulse: 94 84  Temp: 98.8 F (37.1 C) 98.5 F (36.9 C)  Resp: 18 18   Filed Vitals:   12/25/15 1534 12/25/15 2138 12/26/15 0147 12/26/15 0515  BP: 118/69  135/85 106/67 116/71  Pulse: 94  94 84  Temp: 99.3 F (37.4 C) 98.7 F (37.1 C) 98.8 F (37.1 C) 98.5 F (36.9 C)  TempSrc: Oral Oral Oral Oral  Resp: Height:  (1.6 m)     Weight: 83.462 kg (184 lb)     SpO2: 100% 100% 100% 100%    General: Pt is alert, awake, not in acute distress Cardiovascular: RRR, S1/S2 +, no rubs, no gallops Respiratory: CTA bilaterally, no wheezing, no rhonchi Abdominal: Soft, NT, ND, bowel sounds + Extremities: no edema, no cyanosis   The results of significant diagnostics from this hospitalization (including imaging, microbiology, ancillary and laboratory) are listed below for reference.    Significant Diagnostic Studies: No results found.   Microbiology: Recent Results (from the past 240 hour(s))  MRSA PCR Screening     Status: None   Collection Time: 12/24/15  5:16 PM  Result Value Ref Range Status   MRSA by PCR NEGATIVE NEGATIVE Final    Comment:        The GeneXpert MRSA Assay (FDA approved for NASAL specimens only), is one component of a comprehensive MRSA colonization surveillance program. It is not intended to diagnose MRSA infection nor to guide or monitor treatment for MRSA infections.      Labs: Basic Metabolic Panel:  Recent Labs Lab 12/24/15 1658 12/24/15 2131 12/25/15 0117 12/25/15 0549 12/25/15 1124 12/26/15 0313  NA 138 133* 135 136  --  136  K 3.2* 2.7* 2.8* 2.8*  --  3.2*  CL 113* 109 108 108  --  108  CO2 12* 16* 17* 19*  --  19*  GLUCOSE 182* 224* 136* 143*  --  243*  BUN <5* <5* <5* <5*  --  <5*  CREATININE 0.87 0.81 0.72 0.72  --  0.68  CALCIUM 7.6* 7.8* 8.3* 8.4*  --  8.4*  MG  --   --   --   --  1.5* 1.9   Liver Function Tests:  Recent Labs Lab 12/22/15 0818  AST 20  ALT 19  ALKPHOS 88  BILITOT 0.6  PROT 8.5*  ALBUMIN 4.8   No results for input(s): LIPASE, AMYLASE in the last 168 hours. No results for input(s): AMMONIA in the last 168 hours. CBC:  Recent Labs Lab  12/22/15 0818 12/24/15 1216  WBC 7.8 12.2*  HGB 15.5* 14.3  HCT 46.0 43.3  MCV 83.6 84.1  PLT 226.0 222   Cardiac Enzymes:  Recent Labs Lab 12/25/15 1124 12/25/15 1530  TROPONINI 0.03 <0.03   BNP: Invalid input(s): POCBNP CBG:  Recent Labs Lab 12/25/15 0945 12/25/15 1145 12/25/15 1625 12/25/15 2139 12/26/15 0807  GLUCAP 284* 223* 270* 292* 232*    Time coordinating discharge:  Greater than 30 minutes  Signed:  Antino Mayabb, DO [facility] Pager: [phone] 12/26/2015, 10:30 AM

## 2015-12-26 NOTE — Progress Notes (Signed)
Called patient back after her discharge earlier. Pt stated her insurance will not pay for novolog flex pen Novolog R vial--1 vial--4 units with each meal, 1 refill called to [facility] [address]. Pt given Rx for needles, syringes prior to d/c.  DTat

## 2015-12-27 ENCOUNTER — Telehealth: Payer: Self-pay | Admitting: Family Medicine

## 2015-12-27 ENCOUNTER — Telehealth: Payer: Self-pay | Admitting: Adult Health

## 2015-12-27 ENCOUNTER — Other Ambulatory Visit: Payer: Self-pay

## 2015-12-27 DIAGNOSIS — E108 Type 1 diabetes mellitus with unspecified complications: Secondary | ICD-10-CM

## 2015-12-27 LAB — GLUTAMIC ACID DECARBOXYLASE AUTO ABS

## 2015-12-27 LAB — C-PEPTIDE: C-Peptide: 0.9 ng/mL — ABNORMAL LOW (ref 1.1–4.4)

## 2015-12-27 MED ORDER — ONETOUCH LANCETS MISC: Status: DC

## 2015-12-27 MED ORDER — GLUCOSE BLOOD VI STRP
ORAL_STRIP | Status: DC
Start: 2015-12-27 — End: 2015-12-28

## 2015-12-27 NOTE — Telephone Encounter (Signed)
FYI.  Pt changed from insulin aspart (NOVOLOG FLEXPEN) 100 UNIT/ML FlexPen to Insulin Glargine (BASAGLAR KWIKPEN) 100 UNIT/ML SOPN.

## 2015-12-27 NOTE — Telephone Encounter (Signed)
Spoke to [place] on the phone. She reports that she is feeling " so much better." Her most recent blood sugar was 250.   [facility] and has been drawling up 4 units at a time. She is ok with doing this until she sees Endocrinology.   She is going to follow up with me in 1-2 weeks.   She will inform me if she has any questions.

## 2015-12-27 NOTE — Telephone Encounter (Signed)
[facility] Night - Client TELEPHONE ADVICE RECORD [facility] Patient Name: Tammy Richards Gender: Female DOB: 01/25/1991 Age: [age] Y 2 M 27 D Return Phone Number: [phone] (Primary) Address: City/State/Zip: [place] Client [facility] Night - Client Client Site [facility] - Night Physician Shirline FreesNafziger, Cory - NP Contact Type Call Who Is Calling Patient / Member / Family / Caregiver Call Type Triage / Clinical Relationship To Patient Self Return Phone Number [phone] (Primary) Chief Complaint Prescription Refill or Medication Request (non symptomatic) Reason for Call Symptomatic / Request for Health Information Initial Comment Caller states she was just discharged from hospital. Was given rx for Novolog Flexpen and she is having trouble getting this filled. Translation No Nurse Assessment Nurse: Nolen MuMcKinney, RN, Darla Date/Time Lamount Cohen(Eastern Time): 12/26/2015 2:20:08 PM Confirm and document reason for call. If symptomatic, describe symptoms. You must click the next button to save text entered. ---Caller needs prior authorization for Novolog Flex pen. Has the patient traveled out of the country within the last 30 days? ---Not Applicable Does the patient have any new or worsening symptoms? ---No Please document clinical information provided and list any resource used. ---Notified caller there is no means of getting a prior authorization today for the insulin. Suggested caller call the hospital where she was discharged to get medication from them until you can get approval process completed. Caller verbalized understanding. Guidelines Guideline Title Affirmed Question Affirmed Notes Nurse Date/Time (Eastern Time) Disp. Time Lamount Cohen(Eastern Time) Disposition Final User 12/26/2015 1:26:12 PM Attempt made - message left Nolen MuMcKinney, RN, Darla 12/26/2015 2:22:18 PM Clinical Call Yes Nolen MuMcKinney, RN, Darla

## 2015-12-27 NOTE — Telephone Encounter (Signed)
Rx sent in

## 2015-12-27 NOTE — Telephone Encounter (Signed)
See below

## 2015-12-27 NOTE — Telephone Encounter (Signed)
Pt was at [facility] this weekend for her bs was elevated. Pt received recommendation from md at er to see dr balan. Pt would like a referral to dr balan

## 2015-12-28 ENCOUNTER — Other Ambulatory Visit: Payer: Self-pay

## 2015-12-28 MED ORDER — GLUCOSE BLOOD VI STRP: ORAL_STRIP | Status: AC

## 2015-12-28 MED ORDER — ONETOUCH LANCETS MISC: Status: DC

## 2015-12-28 MED ORDER — ONETOUCH ULTRA MINI W/DEVICE KIT: PACK | Status: AC

## 2016-01-01 LAB — INSULIN ANTIBODIES, BLOOD: Insulin Antibodies, Human: <5 uU/mL

## 2016-01-12 ENCOUNTER — Encounter: Payer: 59 | Attending: Internal Medicine | Admitting: *Deleted

## 2016-01-12 VITALS — Ht 63.5 in | Wt 188.9 lb

## 2016-01-12 DIAGNOSIS — E669 Obesity, unspecified: Secondary | ICD-10-CM | POA: Insufficient documentation

## 2016-01-12 DIAGNOSIS — E119 Type 2 diabetes mellitus without complications: Secondary | ICD-10-CM

## 2016-01-12 NOTE — Patient Instructions (Signed)
Plan:  Aim for 2-3 Carb Choices per meal (30-45 grams)  Aim for 0-2 Carbs per snack if hungry  Include protein in moderation with your meals and snacks Consider reading food labels for Total Carbohydrate of foods Continue with your activity level daily as tolerated Consider checking BG at alternate times per day   Continue taking medication as directed by MD

## 2016-01-20 ENCOUNTER — Encounter: Payer: Self-pay | Admitting: *Deleted

## 2016-01-20 NOTE — Progress Notes (Signed)
Diabetes Self-Management Education  Visit Type: First/Initial  Appt. Start Time: 0900 Appt. End Time: 1030  01/20/2016  [name], identified by name and date of birth, is a 25 y.o. female with a diagnosis of Diabetes: Type 2.   ASSESSMENT  Height 5' 3.5" (1.613 m), weight 188 lb 14.4 oz (85.684 kg), last menstrual period 12/09/2015. Body mass index is 32.93 kg/(m^2).      Diabetes Self-Management Education - 01/12/16 0915    Visit Information   Visit Type First/Initial   Initial Visit   Diabetes Type Type 2   Are you currently following a meal plan? No   Are you taking your medications as prescribed? Yes   Date Diagnosed 11/2015   Health Coping   How would you rate your overall health? Good   Psychosocial Assessment   Patient Belief/Attitude about Diabetes Motivated to manage diabetes   Self-care barriers None   Self-management support Family;Doctor's office   Other persons present Patient;Parent   Patient Concerns Nutrition/Meal planning   Preferred Learning Style Visual   What is the last grade level you completed in school? bachelors degree   Pre-Education Assessment   Patient understands the diabetes disease and treatment process. Needs Instruction   Patient understands incorporating nutritional management into lifestyle. Needs Instruction   Patient undertands incorporating physical activity into lifestyle. Needs Review   Patient understands using medications safely. Needs Review   Patient understands monitoring blood glucose, interpreting and using results Needs Review   Patient understands prevention, detection, and treatment of acute complications. Needs Review   Patient understands prevention, detection, and treatment of chronic complications. Needs Review   Patient understands how to develop strategies to address psychosocial issues. Needs Review   Patient understands how to develop strategies to promote health/change behavior. Needs Review   Complications   Last HgB A1C per patient/outside source 11.1 %   How often do you check your blood sugar? 3-4 times/day   Fasting Blood glucose range (mg/dL) [phone]   Number of hypoglycemic episodes per month 0   Have you had a dilated eye exam in the past 12 months? Yes   Have you had a dental exam in the past 12 months? Yes   Are you checking your feet? Yes   How many days per week are you checking your feet? 7   Dietary Intake   Breakfast fried egg,2 [place] sausage patties and fresh or canned    Snack (morning) no   Lunch salad with chiicken addidd, and vegetables   Snack (afternoon) tuna with 4 crackers   Dinner chicken salad or [place] burger, more fish lately, vegetables    Snack (evening) fruit bar popsicle occasionally   Beverage(s) water, sparkling water   Exercise   Exercise Type Light (walking / raking leaves)  walking daily outside after work   How many days per week to you exercise? 5   How many minutes per day do you exercise? 20   Total minutes per week of exercise 100   Patient Education   Previous Diabetes Education No   Disease state  Definition of diabetes, type 1 and 2, and the diagnosis of diabetes   Nutrition management  Role of diet in the treatment of diabetes and the relationship between the three main macronutrients and blood glucose level;Food label reading, portion sizes and measuring food.;Carbohydrate counting   Physical activity and exercise  Role of exercise on diabetes management, blood pressure control and cardiac health.   Medications Reviewed patients medication  for diabetes, action, purpose, timing of dose and side effects.   Monitoring Purpose and frequency of SMBG.;Identified appropriate SMBG and/or A1C goals.   Acute complications Taught treatment of hypoglycemia - the 15 rule.   Chronic complications Relationship between chronic complications and blood glucose control   Psychosocial adjustment Role of stress on diabetes   Individualized Goals  (developed by patient)   Nutrition Follow meal plan discussed   Physical Activity Exercise 3-5 times per week   Medications take my medication as prescribed   Monitoring  test my blood glucose as discussed   Reducing Risk examine blood glucose patterns   Post-Education Assessment   Patient understands the diabetes disease and treatment process. Demonstrates understanding / competency   Patient understands incorporating nutritional management into lifestyle. Demonstrates understanding / competency   Patient undertands incorporating physical activity into lifestyle. Demonstrates understanding / competency   Patient understands using medications safely. Demonstrates understanding / competency   Patient understands monitoring blood glucose, interpreting and using results Demonstrates understanding / competency   Patient understands prevention, detection, and treatment of acute complications. Demonstrates understanding / competency   Patient understands prevention, detection, and treatment of chronic complications. Demonstrates understanding / competency   Patient understands how to develop strategies to address psychosocial issues. Demonstrates understanding / competency   Patient understands how to develop strategies to promote health/change behavior. Demonstrates understanding / competency   Outcomes   Expected Outcomes Demonstrated interest in learning. Expect positive outcomes   Future DMSE PRN   Program Status Completed      Individualized Plan for Diabetes Self-Management Training:   Learning Objective:  Patient will have a greater understanding of diabetes self-management. Patient education plan is to attend individual and/or group sessions per assessed needs and concerns.   Plan:   Patient Instructions  Plan:  Aim for 2-3 Carb Choices per meal (30-45 grams)  Aim for 0-2 Carbs per snack if hungry  Include protein in moderation with your meals and snacks Consider reading food  labels for Total Carbohydrate of foods Continue with your activity level daily as tolerated Consider checking BG at alternate times per day   Continue taking medication as directed by MD      Expected Outcomes:  Demonstrated interest in learning. Expect positive outcomes  Education material provided: Living Well with Diabetes, A1C conversion sheet, Meal plan card and Carbohydrate counting sheet  If problems or questions, patient to contact team via:  Phone and Email  Future DSME appointment: PRN

## 2016-05-16 ENCOUNTER — Encounter: Payer: Self-pay | Admitting: Adult Health

## 2016-05-16 ENCOUNTER — Ambulatory Visit (INDEPENDENT_AMBULATORY_CARE_PROVIDER_SITE_OTHER): Payer: 59 | Admitting: Adult Health

## 2016-05-16 VITALS — BP 110/76 | Temp 98.6°F | Ht 63.65 in | Wt 186.6 lb

## 2016-05-16 DIAGNOSIS — K582 Mixed irritable bowel syndrome: Secondary | ICD-10-CM | POA: Diagnosis not present

## 2016-05-16 NOTE — Patient Instructions (Addendum)
It was great seeing you today and I am glad you are doing well.   You exam is consistent with IBS type symptoms. This can often be resolved with a high fiber diet and taking daily probiotics. You can buy probiotics at any pharmacy  Please follow up with me if no improvement in the next 1-2 weeks.   Irritable Bowel Syndrome, Adult Irritable bowel syndrome (IBS) is not one specific disease. It is a group of symptoms that affects the organs responsible for digestion (gastrointestinal or GI tract).  To regulate how your GI tract works, your body sends signals back and forth between your intestines and your brain. If you have IBS, there may be a problem with these signals. As a result, your GI tract does not function normally. Your intestines may become more sensitive and overreact to certain things. This is especially true when you eat certain foods or when you are under stress.  There are four types of IBS. These may be determined based on the consistency of your stool:   IBS with diarrhea.   IBS with constipation.   Mixed IBS.   Unsubtyped IBS.  It is important to know which type of IBS you have. Some treatments are more likely to be helpful for certain types of IBS.  CAUSES  The exact cause of IBS is not known. RISK FACTORS You may have a higher risk of IBS if:  You are a woman.  You are younger than 26 years old.  You have a family history of IBS.  You have mental health problems.  You have had bacterial infection of your GI tract. SIGNS AND SYMPTOMS  Symptoms of IBS vary from person to person. The main symptom is abdominal pain or discomfort. Additional symptoms usually include one or more of the following:   Diarrhea, constipation, or both.   Abdominal swelling or bloating.   Feeling full or sick after eating a small or regular-size meal.   Frequent gas.   Mucus in the stool.   A feeling of having more stool left after a bowel movement.  Symptoms tend to  come and go. They may be associated with stress, psychiatric conditions, or nothing at all.  DIAGNOSIS  There is no specific test to diagnose IBS. Your health care provider will make a diagnosis based on a physical exam, medical history, and your symptoms. You may have other tests to rule out other conditions that may be causing your symptoms. These may include:   Blood tests.   X-rays.   CT scan.  Endoscopy and colonoscopy. This is a test in which your GI tract is viewed with a long, thin, flexible tube. TREATMENT There is no cure for IBS, but treatment can help relieve symptoms. IBS treatment often includes:   Changes to your diet, such as:  Eating more fiber.  Avoiding foods that cause symptoms.  Drinking more water.  Eating regular, medium-sized portioned meals.  Medicines. These may include:  Fiber supplements if you have constipation.  Medicine to control diarrhea (antidiarrheal medicines).  Medicine to help control muscle spasms in your GI tract (antispasmodic medicines).  Medicines to help with any mental health issues, such as antidepressants or tranquilizers.  Therapy.  Talk therapy may help with anxiety, depression, or other mental health issues that can make IBS symptoms worse.  Stress reduction.  Managing your stress can help keep symptoms under control. HOME CARE INSTRUCTIONS   Take medicines only as directed by your health care provider.  Eat  a healthy diet.  Avoid foods and drinks with added sugar.  Include more whole grains, fruits, and vegetables gradually into your diet. This may be especially helpful if you have IBS with constipation.  Avoid any foods and drinks that make your symptoms worse. These may include dairy products and caffeinated or carbonated drinks.  Do not eat large meals.  Drink enough fluid to keep your urine clear or pale yellow.  Exercise regularly. Ask your health care provider for recommendations of good activities for  you.  Keep all follow-up visits as directed by your health care provider. This is important. SEEK MEDICAL CARE IF:   You have constant pain.  You have trouble or pain with swallowing.  You have worsening diarrhea. SEEK IMMEDIATE MEDICAL CARE IF:   You have severe and worsening abdominal pain.   You have diarrhea and:   You have a rash, stiff neck, or severe headache.   You are irritable, sleepy, or difficult to awaken.   You are weak, dizzy, or extremely thirsty.   You have bright red blood in your stool or you have black tarry stools.   You have unusual abdominal swelling that is painful.   You vomit continuously.   You vomit blood (hematemesis).   You have both abdominal pain and a fever.    This information is not intended to replace advice given to you by your health care provider. Make sure you discuss any questions you have with your health care provider.   Document Released: 07/16/2005 Document Revised: 08/06/2014 Document Reviewed: 04/02/2014 Elsevier Interactive Patient Education [employer]

## 2016-05-16 NOTE — Progress Notes (Signed)
Subjective:    Patient ID: Tammy Richards, female    DOB: 26-Oct-1990, 26 y.o.   MRN: [id number]  HPI 25 year old female who  has a past medical history of Chicken pox; Diabetes mellitus without complication ([place]); DKA (diabetic ketoacidoses) ([place]); Obesity; Tachycardia; and UTI (urinary tract infection). She presents to the office today for an acute complaint of two weeks of left sided lower abdominal pain. The pain is constant but resolves for a time after having a bowel movement. She does report alternating episodes of diarrhea and constipation. She also has episodes of nausea.   She denies fevers, vomiting,blood in stool or feeling acutely ill.   Denies any UTI or GERD like symptoms.   She is [redacted] weeks pregnant   Review of Systems  Constitutional: Negative.   Respiratory: Negative.   Cardiovascular: Negative.   Gastrointestinal: Positive for abdominal pain, constipation, diarrhea and nausea. Negative for abdominal distention, anal bleeding, blood in stool and rectal pain.  Genitourinary: Negative.   Neurological: Negative.   All other systems reviewed and are negative.  Past Medical History:  Diagnosis Date  . Chicken pox   . Diabetes mellitus without complication ([place])    new dx 11/2015  . DKA (diabetic ketoacidoses) ([place])    11/2015  . Obesity   . Tachycardia   . UTI (urinary tract infection)     Social History   Social History  . Marital status: Single    Spouse name: N/A  . Number of children: N/A  . Years of education: N/A   Occupational History  . Not on file.   Social History Main Topics  . Smoking status: Never Smoker  . Smokeless tobacco: Not on file  . Alcohol use 0.0 oz/week     Comment: socially; once a month   . Drug use: No  . Sexual activity: Not on file   Other Topics Concern  . Not on file   Social History Narrative   Non clinical investigator for medical companies   Not married    Lives by herself    No children    Loves traveling and  going to the beach.     No past surgical history on file.  Family History  Problem Relation Age of Onset  . Thyroid disease Maternal Grandmother   . Hyperlipidemia Paternal Grandmother   . Hyperlipidemia Paternal Grandfather   . Lung cancer Paternal Grandfather   . Arthritis Paternal Grandmother   . Thyroid disease Mother   . Breast cancer Maternal Aunt   . Lung cancer Maternal Aunt   . Stroke      maternal great grandmother  . Hypertension      maternal great grandmother  . Diabetes Other     Maternal great grandmother  . Diabetes Paternal Grandmother     No Known Allergies  Current Outpatient Prescriptions on File Prior to Visit  Medication Sig Dispense Refill  . Blood Glucose Monitoring Suppl (ONE TOUCH ULTRA MINI) w/Device KIT Test blood sugars 5 times daily - Dx: E10.8 1 each 0  . cetirizine (ZYRTEC) 10 MG tablet Take 10 mg by mouth daily.    Marland Kitchen glucose blood (ONE TOUCH TEST STRIPS) test strip Test blood sugars 5 times daily - Dx: E10.8 100 each 12  . insulin aspart (NOVOLOG FLEXPEN) 100 UNIT/ML FlexPen Inject 4 Units into the skin 3 (three) times daily with meals. 5 pen 0  . Insulin Glargine (BASAGLAR KWIKPEN) 100 UNIT/ML SOPN Inject 0.35 mLs (35  Units total) into the skin daily. 5 pen 0  . ONE TOUCH LANCETS MISC Test blood sugars 5 times daily Dx: E10.8 100 each 2  . Radiation protection practitioner (BD SHARPS COLLECTOR) MISC Use container to dispose of sharps 1 each 0   No current facility-administered medications on file prior to visit.     BP 110/76   Temp 98.6 F (37 C) (Oral)   Ht 5' 3.65" (1.617 m)   Wt 186 lb 9.6 oz (84.6 kg)   BMI 32.38 kg/m       Objective:   Physical Exam  Constitutional: She is oriented to person, place, and time. She appears well-developed and well-nourished. No distress.  Cardiovascular: Normal rate, regular rhythm, normal heart sounds and intact distal pulses.  Exam reveals no gallop and no friction rub.   No murmur heard. Pulmonary/Chest:  Effort normal and breath sounds normal. No respiratory distress. She has no wheezes.  Abdominal: Soft. Normal appearance and bowel sounds are normal. She exhibits no distension, no pulsatile midline mass and no mass. There is no hepatosplenomegaly, splenomegaly or hepatomegaly. There is tenderness in the suprapubic area and left lower quadrant. There is no rebound, no guarding and no CVA tenderness.  Neurological: She is alert and oriented to person, place, and time.  Skin: Skin is warm and dry. No rash noted. She is not diaphoretic. No erythema. No pallor.  Psychiatric: She has a normal mood and affect. Her behavior is normal. Judgment and thought content normal.  Nursing note and vitals reviewed.     Assessment & Plan:  1. Irritable bowel syndrome with both constipation and diarrhea - I doubt diverticulitis  - Will trial high fiber diet and daily pro biotic therapy - Follow up if no improvement in the next 1-2 weeks  - Consider GI referral and/or medication such as Linzess  Dorothyann Peng, NP

## 2016-05-23 LAB — OB RESULTS CONSOLE ABO/RH: RH Type: POSITIVE

## 2016-05-23 LAB — OB RESULTS CONSOLE HEPATITIS B SURFACE ANTIGEN: Hepatitis B Surface Ag: NEGATIVE

## 2016-05-23 LAB — OB RESULTS CONSOLE RUBELLA ANTIBODY, IGM: RUBELLA: IMMUNE

## 2016-05-23 LAB — OB RESULTS CONSOLE HIV ANTIBODY (ROUTINE TESTING): HIV: NONREACTIVE

## 2016-05-23 LAB — OB RESULTS CONSOLE GC/CHLAMYDIA
Chlamydia: NEGATIVE
GC PROBE AMP, GENITAL: NEGATIVE

## 2016-05-23 LAB — OB RESULTS CONSOLE ANTIBODY SCREEN: ANTIBODY SCREEN: NEGATIVE

## 2016-05-23 LAB — OB RESULTS CONSOLE RPR: RPR: NONREACTIVE

## 2016-06-14 ENCOUNTER — Other Ambulatory Visit (HOSPITAL_COMMUNITY)
Admission: RE | Admit: 2016-06-14 | Discharge: 2016-06-14 | Disposition: A | Discharge status: Home / Self Care | Payer: 59 | Source: Ambulatory Visit | Attending: Obstetrics and Gynecology | Admitting: Obstetrics and Gynecology

## 2016-06-14 ENCOUNTER — Other Ambulatory Visit: Payer: Self-pay | Admitting: Obstetrics and Gynecology

## 2016-06-14 DIAGNOSIS — Z01419 Encounter for gynecological examination (general) (routine) without abnormal findings: Secondary | ICD-10-CM | POA: Insufficient documentation

## 2016-06-14 DIAGNOSIS — Z113 Encounter for screening for infections with a predominantly sexual mode of transmission: Secondary | ICD-10-CM | POA: Insufficient documentation

## 2016-06-18 LAB — CYTOLOGY - PAP
CHLAMYDIA, DNA PROBE: NEGATIVE
Diagnosis: NEGATIVE
Neisseria Gonorrhea: NEGATIVE

## 2016-06-24 ENCOUNTER — Other Ambulatory Visit: Payer: Self-pay | Admitting: Adult Health

## 2016-06-28 ENCOUNTER — Ambulatory Visit (HOSPITAL_COMMUNITY): Payer: 59

## 2016-07-03 ENCOUNTER — Encounter (HOSPITAL_COMMUNITY): Payer: Self-pay

## 2016-07-03 ENCOUNTER — Ambulatory Visit (HOSPITAL_COMMUNITY): Payer: 59

## 2016-07-03 ENCOUNTER — Ambulatory Visit (HOSPITAL_COMMUNITY)
Admission: RE | Admit: 2016-07-03 | Discharge: 2016-07-03 | Disposition: A | Discharge status: Home / Self Care | Payer: 59 | Source: Ambulatory Visit | Attending: Obstetrics and Gynecology | Admitting: Obstetrics and Gynecology

## 2016-07-03 DIAGNOSIS — Z3A13 13 weeks gestation of pregnancy: Secondary | ICD-10-CM | POA: Diagnosis not present

## 2016-07-03 DIAGNOSIS — E139 Other specified diabetes mellitus without complications: Secondary | ICD-10-CM

## 2016-07-03 DIAGNOSIS — O24011 Pre-existing diabetes mellitus, type 1, in pregnancy, first trimester: Secondary | ICD-10-CM | POA: Diagnosis present

## 2016-07-03 DIAGNOSIS — O99211 Obesity complicating pregnancy, first trimester: Secondary | ICD-10-CM | POA: Insufficient documentation

## 2016-07-03 DIAGNOSIS — O24319 Unspecified pre-existing diabetes mellitus in pregnancy, unspecified trimester: Secondary | ICD-10-CM

## 2016-07-03 DIAGNOSIS — O24919 Unspecified diabetes mellitus in pregnancy, unspecified trimester: Secondary | ICD-10-CM | POA: Insufficient documentation

## 2016-07-03 DIAGNOSIS — E669 Obesity, unspecified: Secondary | ICD-10-CM | POA: Insufficient documentation

## 2016-07-03 NOTE — Progress Notes (Signed)
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 She had one episode of of DKA in late May. Her initial HgbA1C on diagnosis was 11.1%. Her most recent HgbA1C was from 11/16 and was 5.8%. She is currently on insulin that was initiated by her endocrinologist, Dr. Talmage NapBalan. She is uncertain if Dr. Talmage NapBalan will continue to manage her diabetes during the pregnancy. She is currently undergoing evaluation for possible IBS OB History: OB History    No data available      PMH:  Past Medical History:  Diagnosis Date  . Chicken pox   . Diabetes mellitus without complication ([facility])    new dx 11/2015  . DKA (diabetic ketoacidoses) ([facility])    11/2015  . Obesity   . Tachycardia   . UTI (urinary tract infection)     Past surgeries: No past surgical history on file. Meds: See EPIC medications section Allergies: NKDA Fh: See EPIC family history  Social history: See EPIC  Review of Systems: no vaginal bleeding or cramping/contractions, no LOF, no nausea/vomiting. All other systems reviewed and are negative  PE:  VS: See EPIC physical exam GEN: well-appearing female ABD: gravid, nontender  A/P:Pregnancy with LADA being treated as Type 1 diabetes. I have the following recommendations: 1. The patient will call Dr. Talmage NapBalan to see if she will continue her monitoring during the pregnancy. We strongly advocate for a weekly evaluation of the patient's glycemic status. She has had near-euglycemia throughout the first trimester as evidence by stable HgbA1C in September and November. This decreases the risk of structural anomaly, but I anticipate deterioration of her glycemic control as we pass 20 weeks. This, and the increased possibility of DKA  (DKA can occur in pregnancy with blood sugar levels as low as 180) indicates a need for very close  Follow up of her sugars. If Dr. Talmage NapBalan is not willing to do that the task should be assumed by your office, or we can do so if you so desire. 2. Although it is unlikely to be positive, a baseline retinal evaluation and 24 hour urine for protein and creatinine clearance is recommended 3. Fetal echocardiography at 24 weeks 4. I have scheduled an initial evaluation of fetal anatomy in 5 weeks. I recommend repeat evaluations every 4 weeks for growth, and initiation of antepartum testing at 30-32 weeks to continue through delivery  The mosti important aspect at this point is setting up frequent evaluations of the patient's glycemic control. Pleas contact me if there is a problem with getting this started  Thank you for the opportunity to be a part of the care of Tammy Richards. Please contact our office if we can be of further assistance.   I spent approximately 30 minutes with this patient with over 50% of time spent in face-to-face counseling.

## 2016-07-04 ENCOUNTER — Other Ambulatory Visit (HOSPITAL_COMMUNITY): Payer: Self-pay | Admitting: *Deleted

## 2016-07-04 DIAGNOSIS — Z794 Long term (current) use of insulin: Principal | ICD-10-CM

## 2016-07-04 DIAGNOSIS — IMO0001 Reserved for inherently not codable concepts without codable children: Secondary | ICD-10-CM

## 2016-07-04 DIAGNOSIS — O24312 Unspecified pre-existing diabetes mellitus in pregnancy, second trimester: Principal | ICD-10-CM

## 2016-07-06 ENCOUNTER — Other Ambulatory Visit (HOSPITAL_COMMUNITY): Payer: Self-pay

## 2016-07-12 ENCOUNTER — Encounter (HOSPITAL_COMMUNITY): Payer: Self-pay

## 2016-07-12 ENCOUNTER — Ambulatory Visit (HOSPITAL_COMMUNITY): Payer: 59

## 2016-07-17 ENCOUNTER — Ambulatory Visit (HOSPITAL_COMMUNITY): Payer: 59

## 2016-08-10 ENCOUNTER — Encounter (HOSPITAL_COMMUNITY): Payer: Self-pay

## 2016-08-14 ENCOUNTER — Encounter (HOSPITAL_COMMUNITY): Payer: Self-pay

## 2016-08-14 ENCOUNTER — Other Ambulatory Visit (HOSPITAL_COMMUNITY): Payer: Self-pay | Admitting: *Deleted

## 2016-08-14 ENCOUNTER — Ambulatory Visit (HOSPITAL_COMMUNITY)
Admission: RE | Admit: 2016-08-14 | Discharge: 2016-08-14 | Disposition: A | Discharge status: Home / Self Care | Payer: 59 | Source: Ambulatory Visit | Attending: Obstetrics and Gynecology | Admitting: Obstetrics and Gynecology

## 2016-08-14 ENCOUNTER — Other Ambulatory Visit (HOSPITAL_COMMUNITY): Payer: Self-pay | Admitting: Obstetrics and Gynecology

## 2016-08-14 DIAGNOSIS — Z794 Long term (current) use of insulin: Secondary | ICD-10-CM

## 2016-08-14 DIAGNOSIS — Z3A19 19 weeks gestation of pregnancy: Secondary | ICD-10-CM | POA: Diagnosis not present

## 2016-08-14 DIAGNOSIS — IMO0001 Reserved for inherently not codable concepts without codable children: Secondary | ICD-10-CM

## 2016-08-14 DIAGNOSIS — O24312 Unspecified pre-existing diabetes mellitus in pregnancy, second trimester: Principal | ICD-10-CM

## 2016-08-14 DIAGNOSIS — Z363 Encounter for antenatal screening for malformations: Secondary | ICD-10-CM | POA: Diagnosis not present

## 2016-08-14 DIAGNOSIS — O24012 Pre-existing diabetes mellitus, type 1, in pregnancy, second trimester: Secondary | ICD-10-CM | POA: Insufficient documentation

## 2016-09-11 ENCOUNTER — Encounter (HOSPITAL_COMMUNITY): Payer: Self-pay

## 2016-09-11 ENCOUNTER — Other Ambulatory Visit (HOSPITAL_COMMUNITY): Payer: Self-pay | Admitting: Obstetrics and Gynecology

## 2016-09-11 ENCOUNTER — Ambulatory Visit (HOSPITAL_COMMUNITY)
Admission: RE | Admit: 2016-09-11 | Discharge: 2016-09-11 | Disposition: A | Discharge status: Home / Self Care | Payer: 59 | Source: Ambulatory Visit | Attending: Obstetrics and Gynecology | Admitting: Obstetrics and Gynecology

## 2016-09-11 DIAGNOSIS — O24012 Pre-existing diabetes mellitus, type 1, in pregnancy, second trimester: Secondary | ICD-10-CM | POA: Diagnosis not present

## 2016-09-11 DIAGNOSIS — Z3A23 23 weeks gestation of pregnancy: Secondary | ICD-10-CM

## 2016-09-11 DIAGNOSIS — IMO0001 Reserved for inherently not codable concepts without codable children: Secondary | ICD-10-CM

## 2016-09-11 DIAGNOSIS — Z794 Long term (current) use of insulin: Principal | ICD-10-CM

## 2016-09-11 DIAGNOSIS — O99212 Obesity complicating pregnancy, second trimester: Secondary | ICD-10-CM | POA: Insufficient documentation

## 2016-09-11 DIAGNOSIS — O24312 Unspecified pre-existing diabetes mellitus in pregnancy, second trimester: Secondary | ICD-10-CM

## 2016-09-11 DIAGNOSIS — Z362 Encounter for other antenatal screening follow-up: Secondary | ICD-10-CM | POA: Diagnosis present

## 2016-09-12 ENCOUNTER — Other Ambulatory Visit (HOSPITAL_COMMUNITY): Payer: Self-pay | Admitting: *Deleted

## 2016-09-12 DIAGNOSIS — O24012 Pre-existing diabetes mellitus, type 1, in pregnancy, second trimester: Secondary | ICD-10-CM

## 2016-10-09 ENCOUNTER — Ambulatory Visit (HOSPITAL_COMMUNITY)
Admission: RE | Admit: 2016-10-09 | Discharge: 2016-10-09 | Disposition: A | Discharge status: Home / Self Care | Payer: 59 | Source: Ambulatory Visit | Attending: Obstetrics and Gynecology | Admitting: Obstetrics and Gynecology

## 2016-10-09 ENCOUNTER — Other Ambulatory Visit (HOSPITAL_COMMUNITY): Payer: Self-pay | Admitting: *Deleted

## 2016-10-09 ENCOUNTER — Other Ambulatory Visit (HOSPITAL_COMMUNITY): Payer: Self-pay | Admitting: Maternal and Fetal Medicine

## 2016-10-09 ENCOUNTER — Encounter (HOSPITAL_COMMUNITY): Payer: Self-pay

## 2016-10-09 DIAGNOSIS — Z362 Encounter for other antenatal screening follow-up: Secondary | ICD-10-CM

## 2016-10-09 DIAGNOSIS — Z3A27 27 weeks gestation of pregnancy: Secondary | ICD-10-CM | POA: Insufficient documentation

## 2016-10-09 DIAGNOSIS — O99212 Obesity complicating pregnancy, second trimester: Secondary | ICD-10-CM

## 2016-10-09 DIAGNOSIS — O24012 Pre-existing diabetes mellitus, type 1, in pregnancy, second trimester: Secondary | ICD-10-CM | POA: Diagnosis present

## 2016-10-09 DIAGNOSIS — O24019 Pre-existing diabetes mellitus, type 1, in pregnancy, unspecified trimester: Secondary | ICD-10-CM

## 2016-11-06 ENCOUNTER — Ambulatory Visit (HOSPITAL_COMMUNITY)
Admission: RE | Admit: 2016-11-06 | Discharge: 2016-11-06 | Disposition: A | Discharge status: Home / Self Care | Payer: 59 | Source: Ambulatory Visit | Attending: Obstetrics and Gynecology | Admitting: Obstetrics and Gynecology

## 2016-11-06 ENCOUNTER — Encounter (HOSPITAL_COMMUNITY): Payer: Self-pay

## 2016-11-06 DIAGNOSIS — O24019 Pre-existing diabetes mellitus, type 1, in pregnancy, unspecified trimester: Secondary | ICD-10-CM

## 2016-11-06 DIAGNOSIS — O99212 Obesity complicating pregnancy, second trimester: Secondary | ICD-10-CM | POA: Insufficient documentation

## 2016-11-06 DIAGNOSIS — Z3A31 31 weeks gestation of pregnancy: Secondary | ICD-10-CM | POA: Insufficient documentation

## 2016-11-06 DIAGNOSIS — O24012 Pre-existing diabetes mellitus, type 1, in pregnancy, second trimester: Secondary | ICD-10-CM | POA: Diagnosis present

## 2016-11-07 ENCOUNTER — Other Ambulatory Visit (HOSPITAL_COMMUNITY): Payer: Self-pay | Admitting: *Deleted

## 2016-11-07 DIAGNOSIS — Z794 Long term (current) use of insulin: Principal | ICD-10-CM

## 2016-11-07 DIAGNOSIS — O24313 Unspecified pre-existing diabetes mellitus in pregnancy, third trimester: Principal | ICD-10-CM

## 2016-11-07 DIAGNOSIS — IMO0001 Reserved for inherently not codable concepts without codable children: Secondary | ICD-10-CM

## 2016-11-08 ENCOUNTER — Encounter (HOSPITAL_BASED_OUTPATIENT_CLINIC_OR_DEPARTMENT_OTHER): Payer: Self-pay | Admitting: Emergency Medicine

## 2016-11-08 ENCOUNTER — Emergency Department (HOSPITAL_BASED_OUTPATIENT_CLINIC_OR_DEPARTMENT_OTHER)
Admission: EM | Admit: 2016-11-08 | Discharge: 2016-11-08 | Disposition: A | Discharge status: Home / Self Care | Payer: 59 | Attending: Emergency Medicine | Admitting: Emergency Medicine

## 2016-11-08 DIAGNOSIS — E119 Type 2 diabetes mellitus without complications: Secondary | ICD-10-CM | POA: Insufficient documentation

## 2016-11-08 DIAGNOSIS — Z794 Long term (current) use of insulin: Secondary | ICD-10-CM | POA: Diagnosis not present

## 2016-11-08 DIAGNOSIS — Z79899 Other long term (current) drug therapy: Secondary | ICD-10-CM | POA: Insufficient documentation

## 2016-11-08 DIAGNOSIS — R110 Nausea: Secondary | ICD-10-CM | POA: Diagnosis not present

## 2016-11-08 DIAGNOSIS — R51 Headache: Secondary | ICD-10-CM | POA: Diagnosis not present

## 2016-11-08 DIAGNOSIS — Z3A31 31 weeks gestation of pregnancy: Secondary | ICD-10-CM | POA: Insufficient documentation

## 2016-11-08 DIAGNOSIS — O9989 Other specified diseases and conditions complicating pregnancy, childbirth and the puerperium: Secondary | ICD-10-CM | POA: Diagnosis not present

## 2016-11-08 DIAGNOSIS — R519 Headache, unspecified: Secondary | ICD-10-CM

## 2016-11-08 LAB — URINALYSIS, MICROSCOPIC (REFLEX)

## 2016-11-08 LAB — COMPREHENSIVE METABOLIC PANEL
ALBUMIN: 3.4 g/dL — AB (ref 3.5–5.0)
ALT: 13 U/L — ABNORMAL LOW (ref 14–54)
ANION GAP: 8 (ref 5–15)
AST: 16 U/L (ref 15–41)
Alkaline Phosphatase: 113 U/L (ref 38–126)
BILIRUBIN TOTAL: 0.3 mg/dL (ref 0.3–1.2)
BUN: 5 mg/dL — AB (ref 6–20)
CHLORIDE: 105 mmol/L (ref 101–111)
CO2: 22 mmol/L (ref 22–32)
Calcium: 9.6 mg/dL (ref 8.9–10.3)
Creatinine, Ser: 0.45 mg/dL (ref 0.44–1.00)
GFR calc Af Amer: >60 mL/min (ref >60)
Glucose, Bld: 75 mg/dL (ref 65–99)
POTASSIUM: 3.5 mmol/L (ref 3.5–5.1)
Sodium: 135 mmol/L (ref 135–145)
TOTAL PROTEIN: 7.7 g/dL (ref 6.5–8.1)

## 2016-11-08 LAB — CBC WITH DIFFERENTIAL/PLATELET
BASOS ABS: 0 10*3/uL (ref 0.0–0.1)
Band Neutrophils: 2 %
Basophils Relative: 0 %
EOS PCT: 1 %
Eosinophils Absolute: 0.1 10*3/uL (ref 0.0–0.7)
HEMATOCRIT: 35.1 % — AB (ref 36.0–46.0)
Hemoglobin: 12 g/dL (ref 12.0–15.0)
LYMPHS ABS: 1.7 10*3/uL (ref 0.7–4.0)
Lymphocytes Relative: 15 %
MCH: 29.1 pg (ref 26.0–34.0)
MCHC: 34.2 g/dL (ref 30.0–36.0)
MCV: 85.2 fL (ref 78.0–100.0)
MONO ABS: 0.7 10*3/uL (ref 0.1–1.0)
Monocytes Relative: 6 %
NEUTROS ABS: 8.8 10*3/uL — AB (ref 1.7–7.7)
NEUTROS PCT: 76 %
PLATELETS: 189 10*3/uL (ref 150–400)
RBC: 4.12 MIL/uL (ref 3.87–5.11)
RDW: 12.8 % (ref 11.5–15.5)
WBC: 11.3 10*3/uL — AB (ref 4.0–10.5)

## 2016-11-08 LAB — URINALYSIS, ROUTINE W REFLEX MICROSCOPIC
Bilirubin Urine: NEGATIVE
Glucose, UA: NEGATIVE mg/dL
Hgb urine dipstick: NEGATIVE
Ketones, ur: NEGATIVE mg/dL
Nitrite: NEGATIVE
PROTEIN: NEGATIVE mg/dL
SPECIFIC GRAVITY, URINE: 1.008 (ref 1.005–1.030)
pH: 7 (ref 5.0–8.0)

## 2016-11-08 MED ORDER — SODIUM CHLORIDE 0.9 % IV BOLUS (SEPSIS)
1000.0000 mL | Freq: Once | INTRAVENOUS | Status: AC
  Administered 2016-11-08: 1000 mL via INTRAVENOUS

## 2016-11-08 MED ORDER — DIPHENHYDRAMINE HCL 50 MG/ML IJ SOLN
25.0000 mg | Freq: Once | INTRAMUSCULAR | Status: AC
  Administered 2016-11-08: 25 mg via INTRAVENOUS
  Filled 2016-11-08: qty 1

## 2016-11-08 MED ORDER — PROCHLORPERAZINE EDISYLATE 5 MG/ML IJ SOLN
10.0000 mg | Freq: Once | INTRAMUSCULAR | Status: AC
  Administered 2016-11-08: 10 mg via INTRAVENOUS
  Filled 2016-11-08: qty 2

## 2016-11-08 MED ORDER — PROCHLORPERAZINE MALEATE 10 MG PO TABS: 10.0000 mg | ORAL_TABLET | Freq: Two times a day (BID) | ORAL | 0 refills | Status: AC | PRN

## 2016-11-08 NOTE — ED Notes (Signed)
Carollee Herter, maternal rapid response nurse states pt is ok to come off monitors and follow up with her next regularly scheduled ob appointment. Joss, rn informed.

## 2016-11-08 NOTE — ED Provider Notes (Signed)
[facility] DEPT [facility] Provider Note   CSN: [id number] Arrival date & time: 11/08/16  1300     History   Chief Complaint Chief Complaint  Patient presents with  . Headache    [redacted]  weeks pregnant     HPI Tammy Richards is a 26 y.o. female.  HPI   26 year old female with a history of type 1 diabetes at [redacted] weeks pregnant presents with headache. Patient reports the headache started 1 week ago, however over the last 2 days has become throbbing. Reports that it is throbbing on the left side. Denies vomiting, numbness, weakness, trauma. She saw her OB earlier this week, and was given Fioricet, however reports that this seems to make her headache worse. Reports she does have a history of some headaches in the past, however not have been as severe as this. Reports that the headache began gradually and has worsened over the last week. Tylenol has also not helped her pain. No blurred vision.   Past Medical History:  Diagnosis Date  . Chicken pox   . Diabetes mellitus without complication ([place])    new dx 11/2015  . DKA (diabetic ketoacidoses) ([place])    11/2015  . Obesity   . Tachycardia   . UTI (urinary tract infection)     Patient Active Problem List   Diagnosis Date Noted  . Latent autoimmune diabetes in adults (LADA), managed as type 1 ([place]) 07/03/2016  . Diabetes mellitus during pregnancy, antepartum 07/03/2016  . Dehydration 12/25/2015  . DKA (diabetic ketoacidoses) ([place]) 12/24/2015  . Diabetes ([place]) 12/24/2015  . Hyponatremia 12/24/2015  . Acute kidney injury ([place]) 12/24/2015  . Obesity     Past Surgical History:  Procedure Laterality Date  . NO PAST SURGERIES      OB History    Gravida Para Term Preterm AB Living   1 0 0 0 0 0   SAB TAB Ectopic Multiple Live Births   0 0 0 0 0       Home Medications    Prior to Admission medications   Medication Sig Start Date End Date Taking? Authorizing Provider  Blood Glucose Monitoring Suppl (ONE TOUCH ULTRA MINI)  w/Device KIT Test blood sugars 5 times daily - Dx: E10.8 12/28/15   Dorothyann Peng, NP  cetirizine (ZYRTEC) 10 MG tablet Take 10 mg by mouth daily.    Historical Provider, MD  glucose blood (ONE TOUCH TEST STRIPS) test strip Test blood sugars 5 times daily - Dx: E10.8 12/28/15   Dorothyann Peng, NP  insulin aspart (NOVOLOG FLEXPEN) 100 UNIT/ML FlexPen Inject 4 Units into the skin 3 (three) times daily with meals. 12/26/15   Orson Eva, MD  Insulin Glargine (BASAGLAR KWIKPEN) 100 UNIT/ML SOPN Inject 0.35 mLs (35 Units total) into the skin daily. 12/26/15   Orson Eva, MD  [facility] DELICA LANCETS 74Y MISC TEST BLOOD SUGARS 5 TIMES DAILY DX: E10.8 06/25/16   Dorothyann Peng, NP  Prenatal Vit-Fe Fumarate-FA (PRENATAL VITAMIN PO) Take by mouth.    Historical Provider, MD  prochlorperazine (COMPAZINE) 10 MG tablet Take 1 tablet (10 mg total) by mouth 2 (two) times daily as needed for nausea or vomiting (headache). Take 62m of benadryl 11/08/16   EGareth Morgan MD  Sharps Container (BD SHARPS [place] MISC Use container to dispose of sharps 12/23/15   CDorothyann Peng NP    Family History Family History  Problem Relation Age of Onset  . Thyroid disease Mother   . Thyroid disease Maternal Grandmother   .  Hyperlipidemia Paternal Grandmother   . Arthritis Paternal Grandmother   . Diabetes Paternal Grandmother   . Hyperlipidemia Paternal Grandfather   . Lung cancer Paternal Grandfather   . Breast cancer Maternal Aunt   . Lung cancer Maternal Aunt   . Stroke      maternal great grandmother  . Hypertension      maternal great grandmother  . Diabetes Other     Maternal great grandmother    Social History Social History  Substance Use Topics  . Smoking status: Never Smoker  . Smokeless tobacco: Never Used  . Alcohol use 0.0 oz/week     Comment: socially; once a month      Allergies   Patient has no known allergies.   Review of Systems Review of Systems  Constitutional: Negative for fever.    HENT: Negative for sore throat.   Eyes: Negative for visual disturbance.  Respiratory: Negative for cough and shortness of breath.   Cardiovascular: Negative for chest pain.  Gastrointestinal: Positive for nausea. Negative for abdominal pain, constipation, diarrhea and vomiting.  Genitourinary: Negative for difficulty urinating.  Musculoskeletal: Negative for back pain and neck pain.  Skin: Negative for rash.  Neurological: Positive for headaches. Negative for dizziness and syncope.     Physical Exam Updated Vital Signs BP 111/63 (BP Location: Right Arm)   Pulse 95   Temp 98.8 F (37.1 C) (Oral)   Resp 16   Wt 212 lb (96.2 kg)   LMP 04/02/2016   SpO2 99%   BMI 36.79 kg/m   Physical Exam  Constitutional: She is oriented to person, place, and time. She appears well-developed and well-nourished. No distress.  HENT:  Head: Normocephalic and atraumatic.  Eyes: Conjunctivae and EOM are normal.  Neck: Normal range of motion.  Cardiovascular: Normal rate, regular rhythm, normal heart sounds and intact distal pulses.  Exam reveals no gallop and no friction rub.   No murmur heard. Pulmonary/Chest: Effort normal and breath sounds normal. No respiratory distress. She has no wheezes. She has no rales.  Abdominal: Soft. She exhibits no distension. There is no tenderness. There is no guarding.  Gravid   Musculoskeletal: She exhibits no edema or tenderness.  Neurological: She is alert and oriented to person, place, and time. She has normal strength. No cranial nerve deficit or sensory deficit. GCS eye subscore is 4. GCS verbal subscore is 5. GCS motor subscore is 6.  Skin: Skin is warm and dry. No rash noted. She is not diaphoretic. No erythema.  Nursing note and vitals reviewed.    ED Treatments / Results  Labs (all labs ordered are listed, but only abnormal results are displayed) Labs Reviewed  CBC WITH DIFFERENTIAL/PLATELET - Abnormal; Notable for the following:       Result  Value   WBC 11.3 (*)    HCT 35.1 (*)    Neutro Abs 8.8 (*)    All other components within normal limits  COMPREHENSIVE METABOLIC PANEL - Abnormal; Notable for the following:    BUN 5 (*)    Albumin 3.4 (*)    ALT 13 (*)    All other components within normal limits  URINALYSIS, ROUTINE W REFLEX MICROSCOPIC - Abnormal; Notable for the following:    APPearance CLOUDY (*)    Leukocytes, UA LARGE (*)    All other components within normal limits  URINALYSIS, MICROSCOPIC (REFLEX) - Abnormal; Notable for the following:    Bacteria, UA FEW (*)    Squamous Epithelial / LPF  0-5 (*)    All other components within normal limits  URINE CULTURE    EKG  EKG Interpretation None       Radiology No results found.  Procedures Procedures (including critical care time)  Medications Ordered in ED Medications  sodium chloride 0.9 % bolus 1,000 mL (0 mLs Intravenous Stopped 11/08/16 1701)  prochlorperazine (COMPAZINE) injection 10 mg (10 mg Intravenous Given 11/08/16 1520)  diphenhydrAMINE (BENADRYL) injection 25 mg (25 mg Intravenous Given 11/08/16 1515)     Initial Impression / Assessment and Plan / ED Course  I have reviewed the triage vital signs and the nursing notes.  Pertinent labs & imaging results that were available during my care of the patient were reviewed by me and considered in my medical decision making (see chart for details).     26 year old female with a history of diabetes type 1 at [redacted]wk gestation presents with concern for headache.   Headache began slowly, no trauma, no fevers, and normal neurologic exam and have low suspicion for [facility], SDH or meningitis.  Patient was given IV fluids, compazine and benadryl with improvement in headache.  No hypertension, no proteinuria, normal LFTs, no sign of preeclampsia or HELLP syndrome. No concerns on fetal heart monitoring. Discussed with patient's OB/GYN on-call. Will send urine cx, doubt infection. She reports resolution of headache.  Was given a prescription for 5 tablets of Compazine, recommended taking Benadryl, and following up closely as scheduled with her OB. Patient discharged in stable condition with understanding of reasons to return.    Final Clinical Impressions(s) / ED Diagnoses   Final diagnoses:  Acute nonintractable headache, unspecified headache type    New Prescriptions Discharge Medication List as of 11/08/2016  4:46 PM    START taking these medications   Details  prochlorperazine (COMPAZINE) 10 MG tablet Take 1 tablet (10 mg total) by mouth 2 (two) times daily as needed for nausea or vomiting (headache). Take 13m of benadryl, Starting Thu 11/08/2016, Print         EGareth Morgan MD 11/08/16 1726

## 2016-11-08 NOTE — ED Notes (Signed)
Pt placed on toco and fetal heart monitor, Carollee Herter RN maternal rapid response nurse notified.

## 2016-11-08 NOTE — Progress Notes (Addendum)
Dr Dion Body reviewed strip with this RN.  FHT's reactive and reassuring for [redacted] week gestation.  May D/C monitoring.  Keep scheduled appt with OBGYN.   Talked with [facility] RN.  Notified of POC.

## 2016-11-08 NOTE — ED Notes (Signed)
ED Provider at bedside. 

## 2016-11-08 NOTE — ED Triage Notes (Signed)
Patient states that she has had a headache for the last week. The patient has gone to her OB and was given meds, she reports it have not gotten any better, reports that it is worse . Patient is [redacted] weeks pregnant

## 2016-11-10 LAB — URINE CULTURE

## 2016-12-05 ENCOUNTER — Ambulatory Visit (HOSPITAL_COMMUNITY)
Admission: RE | Admit: 2016-12-05 | Discharge: 2016-12-05 | Disposition: A | Discharge status: Home / Self Care | Payer: 59 | Source: Ambulatory Visit | Attending: Obstetrics and Gynecology | Admitting: Obstetrics and Gynecology

## 2016-12-05 ENCOUNTER — Encounter (HOSPITAL_COMMUNITY): Payer: Self-pay

## 2016-12-10 LAB — OB RESULTS CONSOLE GBS: GBS: NEGATIVE

## 2016-12-18 ENCOUNTER — Telehealth (HOSPITAL_COMMUNITY): Payer: Self-pay | Admitting: *Deleted

## 2016-12-18 ENCOUNTER — Encounter (HOSPITAL_COMMUNITY): Payer: Self-pay | Admitting: *Deleted

## 2016-12-18 NOTE — Telephone Encounter (Signed)
Preadmission screen  

## 2016-12-20 ENCOUNTER — Other Ambulatory Visit: Payer: Self-pay | Admitting: Obstetrics and Gynecology

## 2016-12-20 NOTE — H&P (Signed)
Tammy Richards is a 26 y.o. female presenting for induction of labor at 37 wks and 4 days EGA due to poorly controlled type 1 diabetes. Prenatal care provided by Dr. Gerald Leitz with [facility].   OB History    Gravida Para Term Preterm AB Living   1 0 0 0 0 0   SAB TAB Ectopic Multiple Live Births   0 0 0 0 0     Past Medical History:  Diagnosis Date  . Chicken pox   . Diabetes mellitus without complication (HCC)    new dx 11/2015  . DKA (diabetic ketoacidoses) (HCC)    11/2015  . Obesity   . Tachycardia   . UTI (urinary tract infection)   . Vaginal Pap smear, abnormal    Past Surgical History:  Procedure Laterality Date  . COLPOSCOPY    . NO PAST SURGERIES     Family History: family history includes Arthritis in her paternal grandmother; Breast cancer in her maternal aunt; Cancer in her paternal grandfather; Diabetes in her other and paternal grandmother; Hyperlipidemia in her paternal grandfather and paternal grandmother; Lung cancer in her maternal aunt and paternal grandmother; Thyroid disease in her maternal grandmother and mother. Social History:  reports that she has never smoked. She has never used smokeless tobacco. She reports that she drinks alcohol. She reports that she does not use drugs.  Medications: Basaglar 50 units [place] at bedtime Humalog wih meals Prenatal vitamins Zyrtec  Allergies NKDA    Maternal Diabetes: Yes:  Diabetes Type:  Insulin/Medication controlled Genetic Screening: Normal Maternal Ultrasounds/Referrals: Normal Fetal Ultrasounds or other Referrals:  Fetal echo Maternal Substance Abuse:  No Significant Maternal Medications:  Meds include: Other:  Significant Maternal Lab Results:  Lab values include: Group B Strep negative Other Comments:  type 1 diabetes poorly controlled   Review of Systems  Constitutional: Negative.   HENT: Negative.   Eyes: Negative.   Respiratory: Negative.   Cardiovascular: Negative.   Gastrointestinal: Negative.    Genitourinary: Negative.   Musculoskeletal: Negative.   Skin: Negative.   Neurological: Negative.   Endo/Heme/Allergies: Negative.   Psychiatric/Behavioral: Negative.    Maternal Medical History:  Fetal activity: Perceived fetal activity is normal.    Prenatal Complications - Diabetes: type 1. Diabetes is managed by insulin injections.        Last menstrual period 04/02/2016. Maternal Exam:  Abdomen: Estimated fetal weight is ultrasound 12/17/2016 EFW 7 lbs 6 oz (84%ile).   Fetal presentation: vertex  Introitus: Normal vulva. Normal vagina.  Pelvis: adequate for delivery.      Physical Exam  Vitals reviewed. Constitutional: She is oriented to person, place, and time. She appears well-developed and well-nourished.  HENT:  Head: Normocephalic and atraumatic.  Eyes: Conjunctivae are normal. Pupils are equal, round, and reactive to light.  Neck: Normal range of motion. Neck supple.  Cardiovascular: Normal rate and regular rhythm.   Respiratory: Effort normal and breath sounds normal.  GI: There is no tenderness.  Genitourinary: Vagina normal.  Musculoskeletal: Normal range of motion. She exhibits edema.  Neurological: She is alert and oriented to person, place, and time.  Skin: Skin is warm and dry.  Psychiatric: She has a normal mood and affect.    Prenatal labs: ABO, Rh: AB/Positive/-- (10/25 0000) Antibody: Negative (10/25 0000) Rubella: Immune (10/25 0000) RPR: Nonreactive (10/25 0000)  HBsAg: Negative (10/25 0000)  HIV: Non-reactive (10/25 0000)  GBS:    Negative   Assessment/Plan: 37 wks and 4 days  for induction of labor due to Type 1 diabetes that is poorly controlled.  Plan glucostabilizer if initial cbg is greater than 140  Cytotec for cervical ripening.  Anticipate SVD Bernerd PhoNancy Prothero CNM and Dr. Su Hiltoberts covering [place] to 7 am. I will assume care at 7 am.    Stanislaw Acton J. 12/20/2016, 9:45 PM

## 2016-12-20 NOTE — H&P (Deleted)
  The note originally documented on this encounter has been moved the the encounter in which it belongs.  

## 2016-12-21 ENCOUNTER — Inpatient Hospital Stay (HOSPITAL_COMMUNITY)
Admission: RE | Admit: 2016-12-21 | Discharge: 2016-12-24 | DRG: 766 | Disposition: A | Discharge status: Home / Self Care | Payer: 59 | Source: Ambulatory Visit | Attending: Obstetrics and Gynecology | Admitting: Obstetrics and Gynecology

## 2016-12-21 ENCOUNTER — Inpatient Hospital Stay (HOSPITAL_COMMUNITY): Payer: 59 | Admitting: Anesthesiology

## 2016-12-21 ENCOUNTER — Encounter (HOSPITAL_COMMUNITY): Payer: Self-pay

## 2016-12-21 DIAGNOSIS — O2402 Pre-existing diabetes mellitus, type 1, in childbirth: Principal | ICD-10-CM | POA: Diagnosis present

## 2016-12-21 DIAGNOSIS — O324XX0 Maternal care for high head at term, not applicable or unspecified: Secondary | ICD-10-CM | POA: Diagnosis present

## 2016-12-21 DIAGNOSIS — E1065 Type 1 diabetes mellitus with hyperglycemia: Secondary | ICD-10-CM | POA: Diagnosis present

## 2016-12-21 DIAGNOSIS — O24013 Pre-existing diabetes mellitus, type 1, in pregnancy, third trimester: Secondary | ICD-10-CM | POA: Diagnosis present

## 2016-12-21 DIAGNOSIS — Z794 Long term (current) use of insulin: Secondary | ICD-10-CM | POA: Diagnosis not present

## 2016-12-21 DIAGNOSIS — Z3A37 37 weeks gestation of pregnancy: Secondary | ICD-10-CM | POA: Diagnosis not present

## 2016-12-21 HISTORY — DX: Type 1 diabetes mellitus without complications: E10.9

## 2016-12-21 HISTORY — DX: Papillomavirus as the cause of diseases classified elsewhere: B97.7

## 2016-12-21 LAB — GLUCOSE, CAPILLARY
GLUCOSE-CAPILLARY: 120 mg/dL — AB (ref 65–99)
GLUCOSE-CAPILLARY: 134 mg/dL — AB (ref 65–99)
GLUCOSE-CAPILLARY: 89 mg/dL (ref 65–99)
GLUCOSE-CAPILLARY: 89 mg/dL (ref 65–99)
GLUCOSE-CAPILLARY: 96 mg/dL (ref 65–99)
GLUCOSE-CAPILLARY: 99 mg/dL (ref 65–99)
Glucose-Capillary: 143 mg/dL — ABNORMAL HIGH (ref 65–99)
Glucose-Capillary: 90 mg/dL (ref 65–99)

## 2016-12-21 LAB — CBC
HEMATOCRIT: 36.2 % (ref 36.0–46.0)
HEMOGLOBIN: 12.3 g/dL (ref 12.0–15.0)
MCH: 28.6 pg (ref 26.0–34.0)
MCHC: 34 g/dL (ref 30.0–36.0)
MCV: 84.2 fL (ref 78.0–100.0)
Platelets: 184 10*3/uL (ref 150–400)
RBC: 4.3 MIL/uL (ref 3.87–5.11)
RDW: 13.8 % (ref 11.5–15.5)
WBC: 9.3 10*3/uL (ref 4.0–10.5)

## 2016-12-21 LAB — COMPREHENSIVE METABOLIC PANEL
ALBUMIN: 3 g/dL — AB (ref 3.5–5.0)
ALK PHOS: 205 U/L — AB (ref 38–126)
ALT: 18 U/L (ref 14–54)
ANION GAP: 9 (ref 5–15)
AST: 20 U/L (ref 15–41)
BUN: 8 mg/dL (ref 6–20)
CHLORIDE: 105 mmol/L (ref 101–111)
CO2: 22 mmol/L (ref 22–32)
Calcium: 9.9 mg/dL (ref 8.9–10.3)
Creatinine, Ser: 0.55 mg/dL (ref 0.44–1.00)
GFR calc non Af Amer: >60 mL/min (ref >60)
GLUCOSE: 135 mg/dL — AB (ref 65–99)
Potassium: 4 mmol/L (ref 3.5–5.1)
SODIUM: 136 mmol/L (ref 135–145)
Total Bilirubin: 0.5 mg/dL (ref 0.3–1.2)
Total Protein: 6.9 g/dL (ref 6.5–8.1)

## 2016-12-21 LAB — TYPE AND SCREEN
ABO/RH(D): AB POS
Antibody Screen: NEGATIVE

## 2016-12-21 LAB — ABO/RH: ABO/RH(D): AB POS

## 2016-12-21 LAB — RPR: RPR Ser Ql: NONREACTIVE

## 2016-12-21 MED ORDER — OXYTOCIN 40 UNITS IN LACTATED RINGERS INFUSION - SIMPLE MED
2.5000 [IU]/h | INTRAVENOUS | Status: DC
  Administered 2016-12-22: 40 mL via INTRAVENOUS

## 2016-12-21 MED ORDER — SODIUM CHLORIDE 0.45 % IV SOLN: INTRAVENOUS | Status: DC

## 2016-12-21 MED ORDER — LACTATED RINGERS IV SOLN
500.0000 mL | Freq: Once | INTRAVENOUS | Status: DC
Start: 2016-12-21 — End: 2016-12-22

## 2016-12-21 MED ORDER — ONDANSETRON HCL 4 MG/2ML IJ SOLN
4.0000 mg | Freq: Four times a day (QID) | INTRAMUSCULAR | Status: DC | PRN
  Administered 2016-12-22: 4 mg via INTRAVENOUS

## 2016-12-21 MED ORDER — LACTATED RINGERS IV SOLN: 500.0000 mL | Freq: Once | INTRAVENOUS | Status: DC

## 2016-12-21 MED ORDER — TERBUTALINE SULFATE 1 MG/ML IJ SOLN: 0.2500 mg | Freq: Once | INTRAMUSCULAR | Status: DC | PRN

## 2016-12-21 MED ORDER — PHENYLEPHRINE 40 MCG/ML (10ML) SYRINGE FOR IV PUSH (FOR BLOOD PRESSURE SUPPORT): 80.0000 ug | PREFILLED_SYRINGE | INTRAVENOUS | Status: DC | PRN

## 2016-12-21 MED ORDER — EPHEDRINE 5 MG/ML INJ: 10.0000 mg | INTRAVENOUS | Status: DC | PRN

## 2016-12-21 MED ORDER — DEXTROSE 50 % IV SOLN: 25.0000 mL | INTRAVENOUS | Status: DC | PRN

## 2016-12-21 MED ORDER — SOD CITRATE-CITRIC ACID 500-334 MG/5ML PO SOLN
30.0000 mL | ORAL | Status: DC | PRN
  Administered 2016-12-22: 30 mL via ORAL
  Filled 2016-12-21: qty 15

## 2016-12-21 MED ORDER — ACETAMINOPHEN 325 MG PO TABS: 650.0000 mg | ORAL_TABLET | ORAL | Status: DC | PRN

## 2016-12-21 MED ORDER — INSULIN REGULAR BOLUS VIA INFUSION
0.0000 [IU] | Freq: Three times a day (TID) | INTRAVENOUS | Status: DC
  Filled 2016-12-21: qty 10

## 2016-12-21 MED ORDER — DIPHENHYDRAMINE HCL 50 MG/ML IJ SOLN: 12.5000 mg | INTRAMUSCULAR | Status: DC | PRN

## 2016-12-21 MED ORDER — FENTANYL 2.5 MCG/ML BUPIVACAINE 1/10 % EPIDURAL INFUSION (WH - ANES)
14.0000 mL/h | INTRAMUSCULAR | Status: DC | PRN
  Administered 2016-12-21 – 2016-12-22 (×2): 14 mL/h via EPIDURAL
  Filled 2016-12-21: qty 100

## 2016-12-21 MED ORDER — OXYTOCIN 40 UNITS IN LACTATED RINGERS INFUSION - SIMPLE MED
1.0000 m[IU]/min | INTRAVENOUS | Status: DC
  Administered 2016-12-21: 2 m[IU]/min via INTRAVENOUS
  Filled 2016-12-21: qty 1000

## 2016-12-21 MED ORDER — FENTANYL CITRATE (PF) 100 MCG/2ML IJ SOLN: 50.0000 ug | INTRAMUSCULAR | Status: DC | PRN

## 2016-12-21 MED ORDER — OXYCODONE-ACETAMINOPHEN 5-325 MG PO TABS: 1.0000 | ORAL_TABLET | ORAL | Status: DC | PRN

## 2016-12-21 MED ORDER — SODIUM CHLORIDE 0.9 % IV SOLN
INTRAVENOUS | Status: DC
  Filled 2016-12-21: qty 1

## 2016-12-21 MED ORDER — PHENYLEPHRINE 40 MCG/ML (10ML) SYRINGE FOR IV PUSH (FOR BLOOD PRESSURE SUPPORT)
PREFILLED_SYRINGE | INTRAVENOUS | Status: AC
  Filled 2016-12-21: qty 20

## 2016-12-21 MED ORDER — MISOPROSTOL 25 MCG QUARTER TABLET
25.0000 ug | ORAL_TABLET | ORAL | Status: DC | PRN
  Administered 2016-12-21 (×2): 25 ug via VAGINAL
  Filled 2016-12-21 (×2): qty 1

## 2016-12-21 MED ORDER — DEXTROSE-NACL 5-0.45 % IV SOLN: INTRAVENOUS | Status: DC

## 2016-12-21 MED ORDER — LIDOCAINE HCL (PF) 1 % IJ SOLN
INTRAMUSCULAR | Status: DC | PRN
Start: 2016-12-21 — End: 2016-12-22
  Administered 2016-12-21: 4 mL via EPIDURAL

## 2016-12-21 MED ORDER — OXYCODONE-ACETAMINOPHEN 5-325 MG PO TABS: 2.0000 | ORAL_TABLET | ORAL | Status: DC | PRN

## 2016-12-21 MED ORDER — OXYTOCIN BOLUS FROM INFUSION: 500.0000 mL | Freq: Once | INTRAVENOUS | Status: DC

## 2016-12-21 MED ORDER — FENTANYL 2.5 MCG/ML BUPIVACAINE 1/10 % EPIDURAL INFUSION (WH - ANES)
INTRAMUSCULAR | Status: AC
Start: 2016-12-21 — End: 2016-12-21
  Filled 2016-12-21: qty 100

## 2016-12-21 MED ORDER — LACTATED RINGERS IV SOLN: 500.0000 mL | INTRAVENOUS | Status: DC | PRN

## 2016-12-21 MED ORDER — LACTATED RINGERS IV SOLN
INTRAVENOUS | Status: DC
  Administered 2016-12-21 – 2016-12-22 (×7): via INTRAVENOUS

## 2016-12-21 MED ORDER — LIDOCAINE HCL (PF) 1 % IJ SOLN
30.0000 mL | INTRAMUSCULAR | Status: DC | PRN
Start: 2016-12-21 — End: 2016-12-22

## 2016-12-21 NOTE — Anesthesia Procedure Notes (Signed)
Epidural Patient location during procedure: OB Start time: 12/21/2016 8:08 PM End time: 12/21/2016 8:14 PM  Staffing Anesthesiologist: Shona SimpsonHOLLIS, KEVIN D Performed: anesthesiologist   Preanesthetic Checklist Completed: patient identified, site marked, surgical consent, pre-op evaluation, timeout performed, IV checked, risks and benefits discussed and monitors and equipment checked  Epidural Patient position: sitting Prep: ChloraPrep Patient monitoring: heart rate, continuous pulse ox and blood pressure Approach: midline Location: L3-L4 Injection technique: LOR saline  Needle:  Needle type: Tuohy  Needle gauge: 17 G Needle length: 9 cm Catheter type: closed end flexible Catheter size: 20 Guage Test dose: negative and 1.5% lidocaine  Assessment Events: blood not aspirated, injection not painful, no injection resistance and no paresthesia  Additional Notes LOR @ 6.5  Patient identified. Risks/Benefits/Options discussed with patient including but not limited to bleeding, infection, nerve damage, paralysis, failed block, incomplete pain control, headache, blood pressure changes, nausea, vomiting, reactions to medications, itching and postpartum back pain. Confirmed with bedside nurse the patient's most recent platelet count. Confirmed with patient that they are not currently taking any anticoagulation, have any bleeding history or any family history of bleeding disorders. Patient expressed understanding and wished to proceed. All questions were answered. Sterile technique was used throughout the entire procedure. Please see nursing notes for vital signs. Test dose was given through epidural catheter and negative prior to continuing to dose epidural or start infusion. Warning signs of high block given to the patient including shortness of breath, tingling/numbness in hands, complete motor block, or any concerning symptoms with instructions to call for help. Patient was given instructions on  fall risk and not to get out of bed. All questions and concerns addressed with instructions to call with any issues or inadequate analgesia.    Reason for block:procedure for pain

## 2016-12-21 NOTE — Progress Notes (Signed)
  Subjective: Patient still does not feel contractions., +FM no lof no vaginal bleeding   Objective: BP 126/78   Pulse (!) 111   Temp 98.7 F (37.1 C) (Oral)   Resp 20   Ht 5' 3.5" (1.613 m)   Wt 99.3 kg (219 lb)   LMP 04/02/2016   BMI 38.19 kg/m  No intake/output data recorded. No intake/output data recorded.  FHT:  FHR: 140 bpm, variability: moderate,  accelerations:  Present,  decelerations:  Absent UC:   regular, every 2-3 minutes on 24 mU of pitocien  SVE: 4.5/70/-2 AROM clear fluid  IUPC and FSE Placed  Labs: Lab Results  Component Value Date   WBC 9.3 12/21/2016   HGB 12.3 12/21/2016   HCT 36.2 12/21/2016   MCV 84.2 12/21/2016   PLT 184 12/21/2016    Assessment / Plan: 37 wks and 4 days with type 1 diabetes poorly controlled admitted for induction   Labor: continue pitocin  TO reach MVU's 200 Preeclampsia:  NA Fetal Wellbeing:  Category I Pain Control:  Labor support without medications I/D:  n/a Anticipated MOD:  NSVD  Tammy Richards J. 12/21/2016, 6:34 PM

## 2016-12-21 NOTE — Progress Notes (Signed)
Tammy Richards is a 26 y.o. G1P0000 at 6467w4d by LMP admitted for induction of labor due to type 1 diabetes poorly controlled.  Subjective: Pt does not feel contractions. Feels pressure at times. +FM no lof no vaginal bleeding   Objective: BP 126/78   Pulse (!) 111   Temp 98.7 F (37.1 C) (Oral)   Resp 20   Ht 5' 3.5" (1.613 m)   Wt 99.3 kg (219 lb)   LMP 04/02/2016   BMI 38.19 kg/m  No intake/output data recorded. No intake/output data recorded.  FHT:  FHR: 140 bpm, variability: moderate,  accelerations:  Present,  decelerations:  Absent UC:   irregular, every 2-5 minutes SVE:   Dilation: 4.5 Effacement (%): 70 Station: -3, Ballotable Exam by:: Dr. Richardson Doppole  Labs: Lab Results  Component Value Date   WBC 9.3 12/21/2016   HGB 12.3 12/21/2016   HCT 36.2 12/21/2016   MCV 84.2 12/21/2016   PLT 184 12/21/2016    Assessment / Plan: [facility],  progressing well on pitocin  Labor: Progressing on Pitocin, will continue to increase then AROM Preeclampsia:  NA Fetal Wellbeing:  Category I Pain Control:  Labor support without medications I/D:  n/a Anticipated MOD:  NSVD  [name] J. 12/21/2016, 6:19 PM

## 2016-12-21 NOTE — Progress Notes (Signed)
Patients initial CBG 143 on 5/25 0100. Called N Prothero CNM for orders. Glucostabilizers orders entered. Upon reassessment of patients CBG 134 on 5/25 0200. N Prothero CNM notified since Glucostabilizers low limit is CBG 140. Per CNM to hold initiating glucostabilizer and recheck CBG in 2 hours. Patient aware of plan of care.

## 2016-12-21 NOTE — Anesthesia Pain Management Evaluation Note (Signed)
  CRNA Pain Management Visit Note  Patient: Tammy Richards, 26 y.o., female  "Hello I am a [facility] of the [facility]. We have an anesthesia team available at all times to provide care throughout the hospital, including epidural management and anesthesia for C-section. I don't know your plan for the delivery whether it a natural birth, water birth, IV sedation, nitrous supplementation, doula or epidural, but we want to meet your pain goals."   1.Was your pain managed to your expectations on prior hospitalizations?   No prior hospitalizations  2.What is your expectation for pain management during this hospitalization?     Epidural  3.How can we help you reach that goal?   Record the patient's initial score and the patient's pain goal.   Pain: 0  Pain Goal: 6 [facility] wants you to be able to say your pain was always managed very well.  Laban EmperorMalinova,Galileo Colello Hristova 12/21/2016

## 2016-12-21 NOTE — Anesthesia Preprocedure Evaluation (Signed)
Anesthesia Evaluation  Patient identified by MRN, date of birth, ID band Patient awake    Reviewed: Allergy & Precautions, Patient's Chart, lab work & pertinent test results  Airway Mallampati: II       Dental no notable dental hx.    Pulmonary neg pulmonary ROS,    Pulmonary exam normal        Cardiovascular negative cardio ROS   Rhythm:Regular Rate:Normal     Neuro/Psych negative neurological ROS  negative psych ROS   GI/Hepatic negative GI ROS, Neg liver ROS,   Endo/Other  diabetes, Type 1  Renal/GU   negative genitourinary   Musculoskeletal negative musculoskeletal ROS (+)   Abdominal   Peds negative pediatric ROS (+)  Hematology negative hematology ROS (+)   Anesthesia Other Findings   Reproductive/Obstetrics (+) Pregnancy                             Lab Results  Component Value Date   WBC 9.3 12/21/2016   HGB 12.3 12/21/2016   HCT 36.2 12/21/2016   MCV 84.2 12/21/2016   PLT 184 12/21/2016     Anesthesia Physical Anesthesia Plan  ASA: II  Anesthesia Plan: Epidural   Post-op Pain Management:    Induction:   Airway Management Planned:   Additional Equipment:   Intra-op Plan:   Post-operative Plan:   Informed Consent: I have reviewed the patients History and Physical, chart, labs and discussed the procedure including the risks, benefits and alternatives for the proposed anesthesia with the patient or authorized representative who has indicated his/her understanding and acceptance.     Plan Discussed with:   Anesthesia Plan Comments:         Anesthesia Quick Evaluation

## 2016-12-21 NOTE — Progress Notes (Signed)
Spoke with Lubertha BasqueN Prothero, CNM on phone. Provider reviewed tracing remotely. Ok to place next dose of misoprostol.

## 2016-12-22 ENCOUNTER — Encounter (HOSPITAL_COMMUNITY)
Admission: RE | Disposition: A | Discharge status: Home / Self Care | Payer: Self-pay | Source: Ambulatory Visit | Attending: Obstetrics and Gynecology

## 2016-12-22 ENCOUNTER — Encounter (HOSPITAL_COMMUNITY): Payer: Self-pay

## 2016-12-22 LAB — GLUCOSE, CAPILLARY
Glucose-Capillary: 76 mg/dL (ref 65–99)
Glucose-Capillary: 115 mg/dL — ABNORMAL HIGH (ref 65–99)
Glucose-Capillary: 93 mg/dL (ref 65–99)
Glucose-Capillary: 110 mg/dL — ABNORMAL HIGH (ref 65–99)
Glucose-Capillary: 102 mg/dL — ABNORMAL HIGH (ref 65–99)

## 2016-12-22 SURGERY — Surgical Case
Anesthesia: Epidural

## 2016-12-22 MED ORDER — ONDANSETRON HCL 4 MG/2ML IJ SOLN
INTRAMUSCULAR | Status: AC
  Filled 2016-12-22: qty 2

## 2016-12-22 MED ORDER — DIPHENHYDRAMINE HCL 25 MG PO CAPS: 25.0000 mg | ORAL_CAPSULE | Freq: Four times a day (QID) | ORAL | Status: DC | PRN

## 2016-12-22 MED ORDER — SIMETHICONE 80 MG PO CHEW
80.0000 mg | CHEWABLE_TABLET | Freq: Three times a day (TID) | ORAL | Status: DC
  Administered 2016-12-22 – 2016-12-24 (×7): 80 mg via ORAL
  Filled 2016-12-22 (×7): qty 1

## 2016-12-22 MED ORDER — INSULIN REGULAR HUMAN 100 UNIT/ML IJ SOLN
4.0000 [IU] | Freq: Once | INTRAMUSCULAR | Status: AC
  Administered 2016-12-22: 4 [IU] via SUBCUTANEOUS
  Filled 2016-12-22: qty 0.04

## 2016-12-22 MED ORDER — SCOPOLAMINE 1 MG/3DAYS TD PT72
1.0000 | MEDICATED_PATCH | Freq: Once | TRANSDERMAL | Status: DC
  Administered 2016-12-22: 1.5 mg via TRANSDERMAL

## 2016-12-22 MED ORDER — KETOROLAC TROMETHAMINE 30 MG/ML IJ SOLN
30.0000 mg | Freq: Four times a day (QID) | INTRAMUSCULAR | Status: AC | PRN
  Administered 2016-12-22: 30 mg via INTRAMUSCULAR

## 2016-12-22 MED ORDER — FERROUS SULFATE 325 (65 FE) MG PO TABS
325.0000 mg | ORAL_TABLET | Freq: Two times a day (BID) | ORAL | Status: DC
  Administered 2016-12-22 – 2016-12-24 (×5): 325 mg via ORAL
  Filled 2016-12-22 (×5): qty 1

## 2016-12-22 MED ORDER — BASAGLAR KWIKPEN 100 UNIT/ML ~~LOC~~ SOPN: 35.0000 [IU] | PEN_INJECTOR | Freq: Every day | SUBCUTANEOUS | Status: DC

## 2016-12-22 MED ORDER — MORPHINE SULFATE (PF) 0.5 MG/ML IJ SOLN
INTRAMUSCULAR | Status: DC | PRN
  Administered 2016-12-22: 4 mg via EPIDURAL

## 2016-12-22 MED ORDER — OXYCODONE-ACETAMINOPHEN 5-325 MG PO TABS: 1.0000 | ORAL_TABLET | ORAL | Status: DC | PRN

## 2016-12-22 MED ORDER — LACTATED RINGERS IV SOLN
INTRAVENOUS | Status: DC
  Administered 2016-12-22 (×2): via INTRAVENOUS

## 2016-12-22 MED ORDER — DIPHENHYDRAMINE HCL 25 MG PO CAPS
25.0000 mg | ORAL_CAPSULE | ORAL | Status: DC | PRN
  Filled 2016-12-22: qty 1

## 2016-12-22 MED ORDER — DIBUCAINE 1 % RE OINT: 1.0000 "application " | TOPICAL_OINTMENT | RECTAL | Status: DC | PRN

## 2016-12-22 MED ORDER — CEFAZOLIN SODIUM-DEXTROSE 2-3 GM-% IV SOLR
INTRAVENOUS | Status: DC | PRN
  Administered 2016-12-22: 2 g via INTRAVENOUS

## 2016-12-22 MED ORDER — MEPERIDINE HCL 25 MG/ML IJ SOLN: 6.2500 mg | INTRAMUSCULAR | Status: DC | PRN

## 2016-12-22 MED ORDER — NALBUPHINE HCL 10 MG/ML IJ SOLN: 5.0000 mg | INTRAMUSCULAR | Status: DC | PRN

## 2016-12-22 MED ORDER — ONDANSETRON HCL 4 MG/2ML IJ SOLN
4.0000 mg | Freq: Three times a day (TID) | INTRAMUSCULAR | Status: DC | PRN
  Filled 2016-12-22: qty 2

## 2016-12-22 MED ORDER — DIPHENHYDRAMINE HCL 50 MG/ML IJ SOLN: 12.5000 mg | INTRAMUSCULAR | Status: DC | PRN

## 2016-12-22 MED ORDER — INSULIN GLARGINE 100 UNIT/ML ~~LOC~~ SOLN
35.0000 [IU] | Freq: Every day | SUBCUTANEOUS | Status: DC
  Administered 2016-12-22: 35 [IU] via SUBCUTANEOUS
  Filled 2016-12-22: qty 0.35

## 2016-12-22 MED ORDER — PRENATAL MULTIVITAMIN CH
1.0000 | ORAL_TABLET | Freq: Every day | ORAL | Status: DC
  Administered 2016-12-23 – 2016-12-24 (×2): 1 via ORAL
  Filled 2016-12-22 (×2): qty 1

## 2016-12-22 MED ORDER — NALBUPHINE HCL 10 MG/ML IJ SOLN
5.0000 mg | Freq: Once | INTRAMUSCULAR | Status: AC | PRN
  Administered 2016-12-22: 5 mg via INTRAVENOUS

## 2016-12-22 MED ORDER — SCOPOLAMINE 1 MG/3DAYS TD PT72
MEDICATED_PATCH | TRANSDERMAL | Status: AC
  Filled 2016-12-22: qty 1

## 2016-12-22 MED ORDER — WITCH HAZEL-GLYCERIN EX PADS: 1.0000 "application " | MEDICATED_PAD | CUTANEOUS | Status: DC | PRN

## 2016-12-22 MED ORDER — SIMETHICONE 80 MG PO CHEW
80.0000 mg | CHEWABLE_TABLET | ORAL | Status: DC
  Administered 2016-12-22 – 2016-12-24 (×2): 80 mg via ORAL
  Filled 2016-12-22 (×2): qty 1

## 2016-12-22 MED ORDER — OXYTOCIN 10 UNIT/ML IJ SOLN
INTRAMUSCULAR | Status: AC
  Filled 2016-12-22: qty 4

## 2016-12-22 MED ORDER — PHENYLEPHRINE 8 MG IN D5W 100 ML (0.08MG/ML) PREMIX OPTIME
INJECTION | INTRAVENOUS | Status: AC
  Filled 2016-12-22: qty 100

## 2016-12-22 MED ORDER — NALOXONE HCL 0.4 MG/ML IJ SOLN: 0.4000 mg | INTRAMUSCULAR | Status: DC | PRN

## 2016-12-22 MED ORDER — PHENYLEPHRINE HCL 10 MG/ML IJ SOLN
INTRAVENOUS | Status: DC | PRN
  Administered 2016-12-22: 10 ug/min via INTRAVENOUS

## 2016-12-22 MED ORDER — ZOLPIDEM TARTRATE 5 MG PO TABS: 5.0000 mg | ORAL_TABLET | Freq: Every evening | ORAL | Status: DC | PRN

## 2016-12-22 MED ORDER — KETOROLAC TROMETHAMINE 30 MG/ML IJ SOLN: 30.0000 mg | Freq: Four times a day (QID) | INTRAMUSCULAR | Status: AC | PRN

## 2016-12-22 MED ORDER — PROMETHAZINE HCL 25 MG/ML IJ SOLN: 6.2500 mg | INTRAMUSCULAR | Status: DC | PRN

## 2016-12-22 MED ORDER — METHYLERGONOVINE MALEATE 0.2 MG PO TABS: 0.2000 mg | ORAL_TABLET | ORAL | Status: DC | PRN

## 2016-12-22 MED ORDER — SIMETHICONE 80 MG PO CHEW: 80.0000 mg | CHEWABLE_TABLET | ORAL | Status: DC | PRN

## 2016-12-22 MED ORDER — IBUPROFEN 600 MG PO TABS
600.0000 mg | ORAL_TABLET | Freq: Four times a day (QID) | ORAL | Status: DC
  Administered 2016-12-22 – 2016-12-24 (×9): 600 mg via ORAL
  Filled 2016-12-22 (×9): qty 1

## 2016-12-22 MED ORDER — SODIUM CHLORIDE 0.9% FLUSH: 3.0000 mL | INTRAVENOUS | Status: DC | PRN

## 2016-12-22 MED ORDER — COCONUT OIL OIL: 1.0000 "application " | TOPICAL_OIL | Status: DC | PRN

## 2016-12-22 MED ORDER — NALBUPHINE HCL 10 MG/ML IJ SOLN
5.0000 mg | INTRAMUSCULAR | Status: DC | PRN
  Filled 2016-12-22: qty 1

## 2016-12-22 MED ORDER — CEFAZOLIN SODIUM-DEXTROSE 2-4 GM/100ML-% IV SOLN: 2.0000 g | Freq: Once | INTRAVENOUS | Status: DC

## 2016-12-22 MED ORDER — AMPICILLIN-SULBACTAM SODIUM 3 (2-1) G IJ SOLR
3.0000 g | INTRAMUSCULAR | Status: AC
  Administered 2016-12-22: 3 g via INTRAVENOUS
  Filled 2016-12-22: qty 3

## 2016-12-22 MED ORDER — NALBUPHINE HCL 10 MG/ML IJ SOLN: 5.0000 mg | Freq: Once | INTRAMUSCULAR | Status: AC | PRN

## 2016-12-22 MED ORDER — FENTANYL CITRATE (PF) 100 MCG/2ML IJ SOLN: 25.0000 ug | INTRAMUSCULAR | Status: DC | PRN

## 2016-12-22 MED ORDER — SENNOSIDES-DOCUSATE SODIUM 8.6-50 MG PO TABS
2.0000 | ORAL_TABLET | ORAL | Status: DC
  Administered 2016-12-22 – 2016-12-24 (×2): 2 via ORAL
  Filled 2016-12-22 (×2): qty 2

## 2016-12-22 MED ORDER — LIDOCAINE-EPINEPHRINE (PF) 2 %-1:200000 IJ SOLN
INTRAMUSCULAR | Status: DC | PRN
  Administered 2016-12-22 (×3): 5 mL via EPIDURAL

## 2016-12-22 MED ORDER — OXYTOCIN 40 UNITS IN LACTATED RINGERS INFUSION - SIMPLE MED: 2.5000 [IU]/h | INTRAVENOUS | Status: AC

## 2016-12-22 MED ORDER — NALOXONE HCL 2 MG/2ML IJ SOSY
1.0000 ug/kg/h | PREFILLED_SYRINGE | INTRAVENOUS | Status: DC | PRN
  Filled 2016-12-22: qty 2

## 2016-12-22 MED ORDER — MENTHOL 3 MG MT LOZG: 1.0000 | LOZENGE | OROMUCOSAL | Status: DC | PRN

## 2016-12-22 MED ORDER — INSULIN ASPART 100 UNIT/ML ~~LOC~~ SOLN: 4.0000 [IU] | Freq: Three times a day (TID) | SUBCUTANEOUS | Status: DC

## 2016-12-22 MED ORDER — CEFAZOLIN SODIUM-DEXTROSE 2-4 GM/100ML-% IV SOLN
INTRAVENOUS | Status: AC
  Filled 2016-12-22: qty 100

## 2016-12-22 MED ORDER — METHYLERGONOVINE MALEATE 0.2 MG/ML IJ SOLN: 0.2000 mg | INTRAMUSCULAR | Status: DC | PRN

## 2016-12-22 MED ORDER — MORPHINE SULFATE (PF) 0.5 MG/ML IJ SOLN
INTRAMUSCULAR | Status: AC
  Filled 2016-12-22: qty 10

## 2016-12-22 MED ORDER — OXYCODONE-ACETAMINOPHEN 5-325 MG PO TABS: 2.0000 | ORAL_TABLET | ORAL | Status: DC | PRN

## 2016-12-22 MED ORDER — SODIUM CHLORIDE 0.9 % IR SOLN
Status: DC | PRN
  Administered 2016-12-22: 1

## 2016-12-22 MED ORDER — ACETAMINOPHEN 325 MG PO TABS
650.0000 mg | ORAL_TABLET | ORAL | Status: DC | PRN
  Administered 2016-12-24: 650 mg via ORAL
  Filled 2016-12-22: qty 2

## 2016-12-22 MED ORDER — KETOROLAC TROMETHAMINE 30 MG/ML IJ SOLN
INTRAMUSCULAR | Status: AC
  Administered 2016-12-22: 30 mg via INTRAMUSCULAR
  Filled 2016-12-22: qty 1

## 2016-12-22 MED ORDER — INSULIN ASPART 100 UNIT/ML FLEXPEN: 4.0000 [IU] | PEN_INJECTOR | Freq: Three times a day (TID) | SUBCUTANEOUS | Status: DC

## 2016-12-22 MED ORDER — LACTATED RINGERS IV SOLN: INTRAVENOUS | Status: DC

## 2016-12-22 SURGICAL SUPPLY — 41 items
BARRIER ADHS 3X4 INTERCEED (GAUZE/BANDAGES/DRESSINGS) ×3 IMPLANT
BENZOIN TINCTURE PRP APPL 2/3 (GAUZE/BANDAGES/DRESSINGS) ×3 IMPLANT
CHLORAPREP W/TINT 26ML (MISCELLANEOUS) ×3 IMPLANT
CLAMP CORD UMBIL (MISCELLANEOUS) IMPLANT
CLOSURE WOUND 1/2 X4 (GAUZE/BANDAGES/DRESSINGS) ×1
CLOTH BEACON ORANGE TIMEOUT ST (SAFETY) ×3 IMPLANT
CONTAINER PREFILL 10% NBF 15ML (MISCELLANEOUS) IMPLANT
DRSG OPSITE POSTOP 4X10 (GAUZE/BANDAGES/DRESSINGS) ×3 IMPLANT
ELECT REM PT RETURN 9FT ADLT (ELECTROSURGICAL) ×3
ELECTRODE REM PT RTRN 9FT ADLT (ELECTROSURGICAL) ×1 IMPLANT
EXTRACTOR VACUUM KIWI (MISCELLANEOUS) IMPLANT
GAUZE SPONGE 4X4 12PLY STRL (GAUZE/BANDAGES/DRESSINGS) ×6 IMPLANT
GLOVE BIOGEL M 6.5 STRL (GLOVE) ×6 IMPLANT
GLOVE BIOGEL PI IND STRL 6.5 (GLOVE) ×1 IMPLANT
GLOVE BIOGEL PI IND STRL 7.0 (GLOVE) ×1 IMPLANT
GLOVE BIOGEL PI INDICATOR 6.5 (GLOVE) ×2
GLOVE BIOGEL PI INDICATOR 7.0 (GLOVE) ×2
GOWN STRL REUS W/TWL LRG LVL3 (GOWN DISPOSABLE) ×9 IMPLANT
HOVERMATT SINGLE USE (MISCELLANEOUS) ×3 IMPLANT
KIT ABG SYR 3ML LUER SLIP (SYRINGE) IMPLANT
NEEDLE HYPO 25X5/8 SAFETYGLIDE (NEEDLE) IMPLANT
NS IRRIG 1000ML POUR BTL (IV SOLUTION) ×3 IMPLANT
PACK C SECTION WH (CUSTOM PROCEDURE TRAY) ×3 IMPLANT
PAD ABD 8X7 1/2 STERILE (GAUZE/BANDAGES/DRESSINGS) ×3 IMPLANT
PAD OB MATERNITY 4.3X12.25 (PERSONAL CARE ITEMS) ×3 IMPLANT
PENCIL SMOKE EVAC W/HOLSTER (ELECTROSURGICAL) ×3 IMPLANT
RTRCTR C-SECT PINK 25CM LRG (MISCELLANEOUS) IMPLANT
STRIP CLOSURE SKIN 1/2X4 (GAUZE/BANDAGES/DRESSINGS) ×2 IMPLANT
SUT PDS AB 0 CT1 27 (SUTURE) ×6 IMPLANT
SUT PLAIN 0 NONE (SUTURE) IMPLANT
SUT VIC AB 0 CTX 36 (SUTURE) ×8
SUT VIC AB 0 CTX36XBRD ANBCTRL (SUTURE) ×4 IMPLANT
SUT VIC AB 2-0 CT1 27 (SUTURE) ×2
SUT VIC AB 2-0 CT1 TAPERPNT 27 (SUTURE) ×1 IMPLANT
SUT VIC AB 2-0 SH 27 (SUTURE) ×2
SUT VIC AB 2-0 SH 27XBRD (SUTURE) ×1 IMPLANT
SUT VIC AB 3-0 SH 27 (SUTURE)
SUT VIC AB 3-0 SH 27X BRD (SUTURE) IMPLANT
SUT VIC AB 4-0 KS 27 (SUTURE) ×3 IMPLANT
TOWEL OR 17X24 6PK STRL BLUE (TOWEL DISPOSABLE) ×3 IMPLANT
TRAY FOLEY BAG SILVER LF 14FR (SET/KITS/TRAYS/PACK) ×3 IMPLANT

## 2016-12-22 NOTE — Transfer of Care (Signed)
Immediate Anesthesia Transfer of Care Note  Patient: Hiram Comberashonda K Lewis  Procedure(s) Performed: Procedure(s): CESAREAN SECTION (N/A)  Patient Location: PACU  Anesthesia Type:Epidural  Level of Consciousness: awake, alert  and oriented  Airway & Oxygen Therapy: Patient Spontanous Breathing  Post-op Assessment: Report given to RN and Post -op Vital signs reviewed and stable  Post vital signs: Reviewed and stable  Last Vitals:  Vitals:   12/22/16 0518 12/22/16 0530  BP:    Pulse:    Resp: 18 18  Temp:      Last Pain:  Vitals:   12/22/16 0427  TempSrc: Oral  PainSc:          Complications: No apparent anesthesia complications

## 2016-12-22 NOTE — Anesthesia Postprocedure Evaluation (Signed)
Anesthesia Post Note  Patient: Tammy Richards  Procedure(s) Performed: Procedure(s) (LRB): CESAREAN SECTION (N/A)  Patient location during evaluation: PACU Anesthesia Type: Epidural Level of consciousness: awake Pain management: pain level controlled Vital Signs Assessment: post-procedure vital signs reviewed and stable Respiratory status: spontaneous breathing Cardiovascular status: stable Postop Assessment: no headache, no backache, epidural receding, patient able to bend at knees and no signs of nausea or vomiting Anesthetic complications: no        Last Vitals:  Vitals:   12/22/16 0823 12/22/16 0830  BP:  118/71  Pulse: 68 87  Resp: 18 16  Temp:      Last Pain:  Vitals:   12/22/16 0830  TempSrc:   PainSc: 0-No pain   Pain Goal:                 Kase Shughart JR,JOHN Jeramine Delis

## 2016-12-22 NOTE — Consult Note (Signed)
[facility]  Delivery Note:  C-section       12/22/2016  5:58 AM  I was called to the operating room at the request of the patient's obstetrician (Dr. Richardson Doppole) for a primary c-section for arrest of descent.  PRENATAL HX:  This is a 26 y/o G1P0 at 3337 and 5/[redacted] weeks gestation who was admitted on 5/24 for IOL due to poorly controlled type 1 diabetes on insulin.  She is GBS negative with AROM x12 hours with clear fluid.  C-section for arrest of descent.   DELIVERY:  Infant was vigorous at delivery, requiring no resuscitation other than standard warming, drying and stimulation.  APGARs 8 and 9.  Exam notable for LGA female with mild caput, otherwise exam within normal limits.  After 5 minutes, baby left with nurse to assist parents with skin-to-skin care.   _____________________ Electronically Signed By: Maryan CharLindsey Nairi Oswald, MD Neonatologist

## 2016-12-22 NOTE — Progress Notes (Signed)
  Subjective: In to see patient due to arrest of dilatation. Pt has been 9 cm for the last 4 hours. She is comfortable with her epidural   Objective: BP 116/61   Pulse 87   Temp 98.5 F (36.9 C) (Oral)   Resp 18   Ht 5' 3.5" (1.613 m)   Wt 99.3 kg (219 lb)   LMP 04/02/2016   SpO2 99%   BMI 38.19 kg/m  No intake/output data recorded. Total I/O In: -  Out: 1000 [Urine:1000]  FHT:  FHR: 140 bpm, variability: moderate,  accelerations:  Abscent,  decelerations:  Absent UC:   regular, every 2-3 minutes mvu 180-210 on 32 mu of pitocin SVE:  9/90/-1 Labs: Lab Results  Component Value Date   WBC 9.3 12/21/2016   HGB 12.3 12/21/2016   HCT 36.2 12/21/2016   MCV 84.2 12/21/2016   PLT 184 12/21/2016    Assessment / Plan: 37 wks and 5 days for induction due to poorly controlled type 1 diaetes Arrest of dilation- Recommend cesarean section R/B/A of cesarean section d/w pt including but not limited to infection/ bleeding / damage to bowel bladder baby with the need for further surgery. R/o Transfusion discussed.  Pt voiced understanding.  An desires to proeed  Terik Haughey J. 12/22/2016, 4:52 AM

## 2016-12-22 NOTE — Op Note (Signed)
Cesarean Section Procedure Note  Indications: failure to progress: arrest of dilation  Pre-operative Diagnosis: 37 week 5 day pregnancy.  Post-operative Diagnosis: same  Surgeon: Jessee AversOLE,Abel Ra J.   Assistants: Sherre ScarletKimberly Williams CNM  Anesthesia: Epidural anesthesia  ASA Class: 2   Procedure Details   The patient was seen in the Holding Room. The risks, benefits, complications, treatment options, and expected outcomes were discussed with the patient.  The patient concurred with the proposed plan, giving informed consent.  The site of surgery properly noted/marked. The patient was taken to Operating Room # 9, identified as Tammy Richards and the procedure verified as C-Section Delivery. A Time Out was held and the above information confirmed.  After induction of anesthesia, the patient was draped and prepped in the usual sterile manner. A Pfannenstiel incision was made and carried down through the subcutaneous tissue to the fascia. Fascial incision was made and extended transversely. The fascia was separated from the underlying rectus tissue superiorly and inferiorly. The peritoneum was identified and entered. Peritoneal incision was extended longitudinally. The utero-vesical peritoneal reflection was incised transversely and the bladder flap was bluntly freed from the lower uterine segment. A low transverse uterine incision was made. Delivered from cephalic presentation was a  Female with Apgar scores of 8 at one minute and 9 at five minutes. After the umbilical cord was clamped and cut cord blood was obtained for evaluation. The placenta was removed intact and appeared normal. The uterine outline, tubes and ovaries appeared normal. The uterine incision was closed with running locked sutures of 0 vicryl. A second layer of 0 vicryl was used to imbricate the incision .Marland Kitchen. Hemostasis was observed. Lavage was carried out until clear. Interseed was placed along the uterine incision.  The fascia was then  reapproximated with running sutures of 0 pds.. The skin was reapproximated with 4-0 vicryl.  Instrument, sponge, and needle counts were correct prior the abdominal closure and at the conclusion of the case.   Findings: Female infant in cephalic presentation. Normal fallopian tubes and ovaries   Estimated Blood Loss:  700 mL         Drains: None         Total IV Fluids:  Per anesthesia ml         Specimens: Placenta to labor and delivery           Implants: none         Complications:  None; patient tolerated the procedure well.         Disposition: PACU - hemodynamically stable.         Condition: stable  Attending Attestation: I performed the procedure.

## 2016-12-22 NOTE — Anesthesia Postprocedure Evaluation (Signed)
Anesthesia Post Note  Patient: Hiram Comberashonda K Lewis  Procedure(s) Performed: Procedure(s) (LRB): CESAREAN SECTION (N/A)  Patient location during evaluation: Mother Baby Anesthesia Type: Epidural Level of consciousness: awake and alert and oriented Pain management: pain level controlled Vital Signs Assessment: post-procedure vital signs reviewed and stable Respiratory status: spontaneous breathing and nonlabored ventilation Cardiovascular status: stable Postop Assessment: no headache, patient able to bend at knees, no backache, no signs of nausea or vomiting, epidural receding and adequate PO intake Anesthetic complications: no        Last Vitals:  Vitals:   12/22/16 1020 12/22/16 1215  BP: (!) 114/54 118/64  Pulse: 94 79  Resp: 18 18  Temp: 37.2 C 37 C    Last Pain:  Vitals:   12/22/16 1215  TempSrc: Axillary  PainSc: 0-No pain   Pain Goal:                 Land O'LakesMalinova,Davine Sweney Hristova

## 2016-12-22 NOTE — Lactation Note (Signed)
This note was copied from a baby's chart. Lactation Consultation Note  Patient Name: Tammy Richards ZOXWR'UToday's Date: 12/22/2016 Reason for consult: Initial assessment;Other (Comment) (Early Term Infant)   Initial assessment with first time mom of 509 hour old early term infant. Mom is a Type 1 Diabetic on insulin. Infant blood glucoses were 45 and 61. Mom with EBL of 700 cc.   Infant asleep in crib, mom reports she has BF once since birth. Undressed and attempted to awaken infant for feeding. Infant did not awaken , she was left on mom's chest.   Mom with large compressible breasts and areola with everted nipples that divot in the centers. Mom reports + breast changes with pregnancy. Showed mom hand expression, glistening of BM from left breast, non from the right. Enc mom to hand express before and after feeding to stimulate milk production.   Discussed colostrum, milk coming to volume, infant nutritional needs, NB feeding behaviors, hand expression, positioning, head and pillow support and cluster feeding. Enc mom to feed infant STS 8-12 x in 24 hours at first feeding cues.  Discussed with mom that infant is an early term infant and that if infant not feeding well in the morning, I would advise starting a DEBP for breast stimulation, mom agreeable. Mom has a Medela PIS at home.   BF Resources Handout and LC Brochure given, mom informed of IP/OP Services, BF Support Groups and LC phone #. Mom without further questions/concerns at this time. Enc mom to call out to desk for feeding assistance as needed.   Report to Shawna Clamparrie Gould RN.    Maternal Data Formula Feeding for Exclusion: No Has patient been taught Hand Expression?: Yes Does the patient have breastfeeding experience prior to this delivery?: No  Feeding Feeding Type: Breast Fed  LATCH Score/Interventions Latch: Too sleepy or reluctant, no latch achieved, no sucking elicited. Intervention(s): Skin to skin;Teach feeding cues;Waking  techniques  Audible Swallowing: None Intervention(s): Hand expression Intervention(s): Alternate breast massage;Hand expression;Skin to skin  Type of Nipple: Everted at rest and after stimulation (Everted nipples, Divoted in center)  Comfort (Breast/Nipple): Soft / non-tender     Hold (Positioning): Assistance needed to correctly position infant at breast and maintain latch. Intervention(s): Breastfeeding basics reviewed;Support Pillows;Position options;Skin to skin  LATCH Score: 5  Lactation Tools Discussed/Used WIC Program: No   Consult Status Consult Status: Follow-up Date: 12/23/16 Follow-up type: In-patient    Tammy Richards 12/22/2016, 4:46 PM

## 2016-12-22 NOTE — Lactation Note (Signed)
This note was copied from a baby's chart. Lactation Consultation Note  Patient Name: Tammy Richards ZOXWR'UToday's Date: 12/22/2016 Reason for consult: Follow-up assessment;Difficult latch;Other (Comment) (Early Term infant)   Follow up with mom of 14 hour old infant at RN request. Infant awake and trying to feed. RN was unable to get infant to sustain latch.   [facility]. Repositioned infant to cross cradle hold and infant was not able to sustain latch, she was dimpling cheeks and was noted to be suckling on top half of nipple. Infant is noted to have high palate and tongue thrusting at breast and on gloved finger. Mom with large semi compressible breasts and areola with everted nipples that flatten some with areolar compression.   After 15 minutes of trying to latch infant, applied # 24 NS to breast. Infant did not sustain latch well and was on and off the breast. Then applied # 20 NS and infant sustained latch better. Feel mom's nipples may need # 24 but infant did better with #20 NS. advised mom to use #20 NS at this time and if pain or blanching noted to base of nipple, change to # 24. Hand expressed mom and obtained 1 gtt per breast that was finger fed to infant. Mom very tired after BF, infant was left STS with FOB.   DEBP set up due to early term infant, EBL 700 cc and NS use. Parents were instructed on assembling, disassembling and cleaning of pump parts. Enc mom to pump every 2-3 hours with DEBP on Initiate phase post BF, enc her not to pump tonight if infant is cluster feeding. Enc mom to hand express post pumping. Instructed that infant should receive all EBM via finger or spoon.   Mom asked her mom about supplementing infant. Discussed LEAD and NB nutritional needs and NB feeding behaviors. Mom did not ask to start formula at this time. Enc mom to call out for feeding assistance as needed.    Maternal Data Formula Feeding for Exclusion:  No Has patient been taught Hand Expression?: Yes Does the patient have breastfeeding experience prior to this delivery?: No  Feeding Feeding Type: Breast Fed Length of feed: 15 min  LATCH Score/Interventions Latch: Repeated attempts needed to sustain latch, nipple held in mouth throughout feeding, stimulation needed to elicit sucking reflex. Intervention(s): Skin to skin;Teach feeding cues;Waking techniques Intervention(s): Adjust position;Assist with latch;Breast massage;Breast compression  Audible Swallowing: A few with stimulation Intervention(s): Skin to skin;Hand expression;Alternate breast massage  Type of Nipple: Everted at rest and after stimulation (flatten some with areolar compression)  Comfort (Breast/Nipple): Soft / non-tender     Hold (Positioning): Assistance needed to correctly position infant at breast and maintain latch. Intervention(s): Breastfeeding basics reviewed;Support Pillows;Position options;Skin to skin  LATCH Score: 7  Lactation Tools Discussed/Used Tools: Nipple Shields Nipple shield size: 20;24 Pump Review: Setup, frequency, and cleaning;Milk Storage Initiated by:: Noralee StainSharon Daylani Deblois, RN, IBCLC Date initiated:: 12/22/16   Consult Status Consult Status: Follow-up Date: 12/23/16 Follow-up type: In-patient    Silas FloodSharon S Deryk Bozman 12/22/2016, 9:40 PM

## 2016-12-22 NOTE — Addendum Note (Signed)
Addendum  created 12/22/16 1556 by Elgie CongoMalinova, Terik Haughey H, CRNA   Sign clinical note

## 2016-12-23 LAB — CBC
HCT: 28.3 % — ABNORMAL LOW (ref 36.0–46.0)
Hemoglobin: 9.5 g/dL — ABNORMAL LOW (ref 12.0–15.0)
MCH: 29 pg (ref 26.0–34.0)
MCHC: 33.6 g/dL (ref 30.0–36.0)
MCV: 86.3 fL (ref 78.0–100.0)
PLATELETS: 131 10*3/uL — AB (ref 150–400)
RBC: 3.28 MIL/uL — AB (ref 3.87–5.11)
RDW: 13.8 % (ref 11.5–15.5)
WBC: 15 10*3/uL — AB (ref 4.0–10.5)

## 2016-12-23 LAB — GLUCOSE, CAPILLARY
GLUCOSE-CAPILLARY: 60 mg/dL — AB (ref 65–99)
GLUCOSE-CAPILLARY: 86 mg/dL (ref 65–99)
GLUCOSE-CAPILLARY: 81 mg/dL (ref 65–99)
GLUCOSE-CAPILLARY: 138 mg/dL — AB (ref 65–99)
Glucose-Capillary: 78 mg/dL (ref 65–99)

## 2016-12-23 LAB — BIRTH TISSUE RECOVERY COLLECTION (PLACENTA DONATION)

## 2016-12-23 MED ORDER — INSULIN GLARGINE 100 UNIT/ML ~~LOC~~ SOLN
32.0000 [IU] | Freq: Every day | SUBCUTANEOUS | Status: DC
  Administered 2016-12-23: 32 [IU] via SUBCUTANEOUS
  Filled 2016-12-23 (×2): qty 0.32

## 2016-12-23 NOTE — Progress Notes (Signed)
Pt fasting blood glucose was 60, pt asymptomatic at time. Pt was given 4 oz of apple juice. Recheck blood glucose 15 min later and the result was 86. Pt continued to be asymptomatic.

## 2016-12-23 NOTE — Progress Notes (Signed)
Subjective: Postpartum Day 1: Cesarean Delivery Patient reports tolerating PO, + flatus and no problems voiding.    Objective: Vital signs in last 24 hours: Temp:  [98.3 F (36.8 C)-100.7 F (38.2 C)] 98.3 F (36.8 C) (05/27 0533) Pulse Rate:  [79-100] 87 (05/27 0533) Resp:  [14-18] 16 (05/27 0533) BP: (107-125)/(53-64) 113/61 (05/27 0533) SpO2:  [95 %-99 %] 99 % (05/27 0533)  Physical Exam:  General: alert, cooperative and no distress Lochia: appropriate Uterine Fundus: firm Incision: bandage still on clean dry and intact  DVT Evaluation: No evidence of DVT seen on physical exam.   Recent Labs  12/21/16 0042 12/23/16 0511  HGB 12.3 9.5*  HCT 36.2 28.3*    Assessment/Plan: Status post Cesarean section. Doing well postoperatively.  Type 1 diabetes- episode of hypoglycemia this AM will reduce dose of glargine from 35 to 32 units qhs.  Encourage ambulation in halls tid Pt desires discharge home tomorrow if stable.  Dr. Charlotta Newtonzan covering 12/24/2016 at 7am.   Demetri Goshert J. 12/23/2016, 8:41 AM

## 2016-12-23 NOTE — Lactation Note (Signed)
This note was copied from a baby's chart. Lactation Consultation Note  Patient Name: Tammy Richards Reason for consult: Follow-up assessment;Difficult latch   Follow up with mom of 34 hour old infant. Mom reports she has been trying to get infant to latch and infant is not interested. Mom reports she is pumping and getting a gtt or two that she is putting in the infant's mouth. GM was feeding infant a bottle of formula.   Mom was tearful when I was in the room. She denied pain. Mom and GM concerned infant went on Phototherapy. Reviewed what jaundice is and that it is common in the NB period. Mom denied further questions/concerns. Enc mom to call out for feeding assistance as needed.    Maternal Data Formula Feeding for Exclusion: No Has patient been taught Hand Expression?: Yes Does the patient have breastfeeding experience prior to this delivery?: No  Feeding Feeding Type: Formula Nipple Type: Slow - flow  LATCH Score/Interventions                      Lactation Tools Discussed/Used Pump Review: Setup, frequency, and cleaning Initiated by:: Reviewed and encouraged   Consult Status Consult Status: Follow-up Date: 12/24/16 Follow-up type: In-patient    Silas FloodSharon S Marquasha Brutus Richards, 4:47 PM

## 2016-12-24 LAB — GLUCOSE, CAPILLARY
Glucose-Capillary: 73 mg/dL (ref 65–99)
Glucose-Capillary: 88 mg/dL (ref 65–99)

## 2016-12-24 MED ORDER — FERROUS SULFATE 325 (65 FE) MG PO TABS: 325.0000 mg | ORAL_TABLET | Freq: Every day | ORAL | 0 refills | Status: AC

## 2016-12-24 MED ORDER — IBUPROFEN 600 MG PO TABS: 600.0000 mg | ORAL_TABLET | Freq: Four times a day (QID) | ORAL | 0 refills | Status: AC

## 2016-12-24 MED ORDER — DOCUSATE SODIUM 100 MG PO CAPS: 100.0000 mg | ORAL_CAPSULE | Freq: Two times a day (BID) | ORAL | 0 refills | Status: AC

## 2016-12-24 NOTE — Discharge Instructions (Addendum)
Cesarean Delivery, Care After  Refer to this sheet in the next few weeks. These instructions provide you with information about caring for yourself after your procedure. Your health care provider may also give you more specific instructions. Your treatment has been planned according to current medical practices, but problems sometimes occur. Call your health care provider if you have any problems or questions after your procedure. What can I expect after the procedure? After the procedure, it is common to have:  A small amount of blood or clear fluid coming from the incision.  Some redness, swelling, and pain in your incision area.  Some abdominal pain and soreness.  Vaginal bleeding (lochia).  Pelvic cramps.  Fatigue. Follow these instructions at home: Incision care    Follow instructions from your health care provider about how to take care of your incision. Make sure you:  Wash your hands with soap and water before you change your bandage (dressing). If soap and water are not available, use hand sanitizer.  Change your dressing as told by your health care provider.  Leave stitches (sutures), skin staples, skin glue, or adhesive strips in place. These skin closures may need to stay in place for 2 weeks or longer. If adhesive strip edges start to loosen and curl up, you may trim the loose edges. Do not remove adhesive strips completely unless your health care provider tells you to do that.  Check your incision area every day for signs of infection. Check for:  More redness, swelling, or pain.  More fluid or blood.  Warmth.  Pus or a bad smell.  When you cough or sneeze, hug a pillow. This helps with pain and decreases the chance of your incision opening up (dehiscing). Do this until your incision heals. Medicines   Take over-the-counter and prescription medicines only as told by your health care provider.  For pain management you may alternate between Ibuprofen and  Tylenol.  If you were prescribed an antibiotic medicine, take it as told by your health care provider. Do not stop taking the antibiotic until it is finished. Driving   Do not drive or operate heavy machinery while taking prescription pain medicine.  Do not drive for 24 hours if you received a sedative. Lifestyle   Do not drink alcohol. This is especially important if you are breastfeeding or taking pain medicine.  Do not use tobacco products, including cigarettes, chewing tobacco, or e-cigarettes. If you need help quitting, ask your health care provider. Tobacco can delay wound healing. Eating and drinking   Drink at least 8 eight-ounce glasses of water every day unless told not to by your health care provider. If you breastfeed, you may need to drink more water than this.  Eat high-fiber foods every day. These foods may help prevent or relieve constipation. High-fiber foods include:  Whole grain cereals and breads.  Brown rice.  Beans.  Fresh fruits and vegetables. Activity   Return to your normal activities as told by your health care provider. Ask your health care provider what activities are safe for you.  Rest as much as possible. Try to rest or take a nap while your baby is sleeping.  Do not lift anything that is heavier than your baby or 10 lb (4.5 kg) as told by your health care provider.  Ask your health care provider when you can engage in sexual activity. This may depend on your:  Risk of infection.  Healing rate.  Comfort and desire to engage in sexual  activity. Bathing   Do not take baths, swim, or use a hot tub until your health care provider approves. Ask your health care provider if you can take showers. You may only be allowed to take sponge baths until your incision heals.  Keep your dressing dry as told by your health care provider. General instructions   Do not use tampons or douches until your health care provider approves.  Wear:  Loose,  comfortable clothing.  A supportive and well-fitting bra.  Watch for any blood clots that may pass from your vagina. These may look like clumps of dark red, brown, or black discharge.  Keep your perineum clean and dry as told by your health care provider.  Wipe from front to back when you use the toilet.  If possible, have someone help you care for your baby and help with household activities for a few days after you leave the hospital.  Keep all follow-up visits for you and your baby as told by your health care provider. This is important. Contact a health care provider if:  You have:  Bad-smelling vaginal discharge.  Difficulty urinating.  Pain when urinating.  A sudden increase or decrease in the frequency of your bowel movements.  More redness, swelling, or pain around your incision.  More fluid or blood coming from your incision.  Pus or a bad smell coming from your incision.  A fever.  A rash.  Little or no interest in activities you used to enjoy.  Questions about caring for yourself or your baby.  Nausea.  Your incision feels warm to the touch.  Your breasts turn red or become painful or hard.  You feel unusually sad or worried.  You vomit.  You pass large blood clots from your vagina. If you pass a blood clot, save it to show to your health care provider. Do not flush blood clots down the toilet without showing your health care provider.  You urinate more than usual.  You are dizzy or light-headed.  You have not breastfed and have not had a menstrual period for 12 weeks after delivery.  You stopped breastfeeding and have not had a menstrual period for 12 weeks after stopping breastfeeding. Get help right away if:  You have:  Pain that does not go away or get better with medicine.  Chest pain.  Difficulty breathing.  Blurred vision or spots in your vision.  Thoughts about hurting yourself or your baby.  New pain in your abdomen or in one  of your legs.  A severe headache.  You faint.  You bleed from your vagina so much that you fill two sanitary pads in one hour. This information is not intended to replace advice given to you by your health care provider. Make sure you discuss any questions you have with your health care provider. Document Released: 04/07/2002 Document Revised: 11/24/2015 Document Reviewed: 06/20/2015 Elsevier Interactive Patient Education  2017 Else[employer] Inc.   Iron-Rich Diet  Iron is a mineral that helps your body to produce hemoglobin. Hemoglobin is a protein in your red blood cells that carries oxygen to your body's tissues. Eating too little iron may cause you to feel weak and tired, and it can increase your risk for infection. Eating enough iron is necessary for your body's metabolism, muscle function, and nervous system. Iron is naturally found in many foods. It can also be added to foods or fortified in foods. There are two types of dietary iron:  Heme iron. Heme iron  is absorbed by the body more easily than nonheme iron. Heme iron is found in meat, poultry, and fish.  Nonheme iron. Nonheme iron is found in dietary supplements, iron-fortified grains, beans, and vegetables. You may need to follow an iron-rich diet if:  You have been diagnosed with iron deficiency or iron-deficiency anemia.  You have a condition that prevents you from absorbing dietary iron, such as:  Infection in your intestines.  Celiac disease. This involves long-lasting (chronic) inflammation of your intestines.  You do not eat enough iron.  You eat a diet that is high in foods that impair iron absorption.  You have lost a lot of blood.  You have heavy bleeding during your menstrual cycle.  You are pregnant. What is my plan? Your health care provider may help you to determine how much iron you need per day based on your condition. Generally, when a person consumes sufficient amounts of iron in the diet, the following  iron needs are met:  Men.  51-11 years old: 11 mg per day.  41-38 years old: 8 mg per day.  Women.  26-34 years old: 15 mg per day.  60-76 years old: 18 mg per day.  Over 80 years old: 8 mg per day.  Pregnant women: 27 mg per day.  Breastfeeding women: 9 mg per day. What do I need to know about an iron-rich diet?  Eat fresh fruits and vegetables that are high in vitamin C along with foods that are high in iron. This will help increase the amount of iron that your body absorbs from food, especially with foods containing nonheme iron. Foods that are high in vitamin C include oranges, peppers, tomatoes, and mango.  Take iron supplements only as directed by your health care provider. Overdose of iron can be life-threatening. If you were prescribed iron supplements, take them with orange juice or a vitamin C supplement.  Cook foods in pots and pans that are made from iron.  Eat nonheme iron-containing foods alongside foods that are high in heme iron. This helps to improve your iron absorption.  Certain foods and drinks contain compounds that impair iron absorption. Avoid eating these foods in the same meal as iron-rich foods or with iron supplements. These include:  Coffee, black tea, and red wine.  Milk, dairy products, and foods that are high in calcium.  Beans, soybeans, and peas.  Whole grains.  When eating foods that contain both nonheme iron and compounds that impair iron absorption, follow these tips to absorb iron better.  Soak beans overnight before cooking.  Soak whole grains overnight and drain them before using.  Ferment flours before baking, such as using yeast in bread dough. What foods can I eat? Grains  Iron-fortified breakfast cereal. Iron-fortified whole-wheat bread. Enriched rice. Sprouted grains. Vegetables  Spinach. Potatoes with skin. Green peas. Broccoli. Red and green bell peppers. Fermented vegetables. Fruits  Prunes. Raisins. Oranges.  Strawberries. Mango. Grapefruit. Meats and Other Protein Sources  Beef liver. Oysters. Beef. Shrimp. [place]. Chicken. [place]. Sardines. Chickpeas. Nuts. Tofu. Beverages  Tomato juice. Fresh orange juice. Prune juice. Hibiscus tea. Fortified instant breakfast shakes. Condiments  Tahini. Fermented soy sauce. Sweets and Desserts  Black-strap molasses. Other  Wheat germ. The items listed above may not be a complete list of recommended foods or beverages. Contact your dietitian for more options.  What foods are not recommended? Grains  Whole grains. Bran cereal. Bran flour. Oats. Vegetables  Artichokes. Brussels sprouts. Kale. Fruits  Blueberries. Raspberries. Strawberries. Figs.  Meats and Other Protein Sources  Soybeans. Products made from soy protein. Dairy  Milk. Cream. Cheese. Yogurt. Cottage cheese. Beverages  Coffee. Black tea. Red wine. Sweets and Desserts  Cocoa. Chocolate. Ice cream. Other  Basil. Oregano. Parsley. The items listed above may not be a complete list of foods and beverages to avoid. Contact your dietitian for more information.  This information is not intended to replace advice given to you by your health care provider. Make sure you discuss any questions you have with your health care provider. Document Released: 02/27/2005 Document Revised: 02/03/2016 Document Reviewed: 02/10/2014 Elsevier Interactive Patient Education  2017 [employer].

## 2016-12-24 NOTE — Progress Notes (Signed)
Subjective: Postpartum Day 2: Cesarean Delivery Pt resting comfortably in bed.  No F/C/CP/SOB.  Tolerating diet.  +flatus, no BM.  Ambulating and voiding without difficulty.  No acute complaints.  Objective: Vital signs in last 24 hours: Temp:  [98.3 F (36.8 C)-99.2 F (37.3 C)] 99.2 F (37.3 C) (05/28 0601) Pulse Rate:  [83-118] 83 (05/28 0601) Resp:  [18] 18 (05/28 0601) BP: (116-118)/(64-66) 118/66 (05/28 0601)  Physical Exam:  General: alert, cooperative and no distress  CV: RRR Lungs: CTAB Lochia: appropriate Abd: soft, non-tender, no distension, +BS Uterine Fundus: firm, below umbilicus Incision: honeycomb dressing on C/D/I DVT Evaluation: No evidence of DVT seen on physical exam.   Recent Labs  12/23/16 0511  HGB 9.5*  HCT 28.3*    Assessment/Plan: Status post Cesarean section. Doing well postoperatively.  Type 1 diabetes-doing well with 32 units glargine and sliding scale with meals. Meeting postpartum milestones appropriately.  Plan for discharge home today  Myna HidalgoOZAN, Myda Detwiler, M 12/24/2016, 9:34 AM

## 2016-12-25 LAB — GLUCOSE, CAPILLARY
Glucose-Capillary: 146 mg/dL — ABNORMAL HIGH (ref 65–99)
Glucose-Capillary: 136 mg/dL — ABNORMAL HIGH (ref 65–99)

## 2016-12-26 NOTE — Lactation Note (Signed)
Lactation Consultation Note: Mother pumping and states she is only getting a few drops. She has flat dimpled nipples. She is able to hand express colostrum. She has difficulty using the nipple shield. She attempt to breastfeed infant this am and reports that infant latched for a few mins. GM giving infant a bottle when I arrived in the room.  Mother advised to page for latch assistance with next feeding.   Patient Name: Tammy Richards OZHYQ'MToday's Date: 12/26/2016     Maternal Data    Feeding    LATCH Score/Interventions                      Lactation Tools Discussed/Used     Consult Status      Michel BickersKendrick, Adelyna Brockman McCoy 12/26/2016, 8:59 AM

## 2016-12-28 ENCOUNTER — Ambulatory Visit: Payer: Self-pay

## 2016-12-28 NOTE — Addendum Note (Signed)
Addendum  created 12/28/16 1157 by Tailynn Armetta D, MD   Sign clinical note    

## 2016-12-28 NOTE — Lactation Note (Signed)
This note was copied from a baby's chart. Lactation Consult  Mother's reason for visit:  Getting infant to feed at the breast Visit Type:  Feeding assessment Appointment Notes:  See Below Consult:  Initial Lactation Consultant:  Ed Blalock  ________________________________________________________________________  Tammy Richards Name:  Tammy Richards Date of Birth:  12/22/2017 Pediatrician:  April Gay, MD Gender:  female Gestational Age: [redacted]w[redacted]d (At Birth) Birth Weight:  8 lb 10.5 oz (3925 g) Weight at Discharge:8 lb 4.1 (3745 grams)                         Date of Discharge:  12/24/2016 There were no vitals filed for this visit. [facility]  5/31 8 lb 4.0 oz     Location:Pediatrician's office Weight today:  8 lb 3.6 oz (3732 grams) with clean diaper ________________________________________________________________________  Mother's Name: Tammy Richards Type of delivery:  C-Section, Low Transverse Breastfeeding Experience:  Baby not feeding well at breast Maternal Medical Conditions:  Type 1 Diabetes Maternal Medications:  PNV, Zyrtec, Basilar Insulin, Hemalog Insulin, Ibuprofen, FE, Colace  ________________________________________________________________________  Breastfeeding History (Post Discharge)  Frequency of breastfeeding:  Few tries a day Duration of feeding:  0  Supplementation  Formula:  Volume 50-60 ml Frequency:  Every 3 hours Total volume per day:   400-480 ml       Brand: Enfamil  Breastmilk:  Volume 30-90ml Frequency:  Twice a day Total volume per day:  60-120 ml  Method:  Bottle, Tommie Tippee  Pumping  Type of pump:  Medela pump in style Frequency:  1-2 x a day Volume:  90-120 ml 1-2 x a day  Infant Intake and Output Assessment  Voids:  8-10 in 24 hrs.  Color:  Clear yellow Stools:  8-10 in 24 hrs.  Color:   Yellow  ________________________________________________________________________  Maternal Breast Assessment  Breast:  Full Nipple:  Erect, semi flat Pain level:  0   _______________________________________________________________________ Feeding Assessment/Evaluation  Initial feeding assessment:  Infant's oral assessment:  WNL  Positioning:  Football Right breast  LATCH documentation:  Latch:  0 = Too sleepy or reluctant, no latch achieved, no sucking elicited.  Audible swallowing:  0 = None  Type of nipple:  2 = Everted at rest and after stimulation  Comfort (Breast/Nipple):  2 = Soft / non-tender  Hold (Positioning):  1 = Assistance needed to correctly position infant at breast and maintain latch  LATCH score:  5  Attached assessment:  Shallow  Lips flanged:  Yes.    Lips untucked:  No.  Suck assessment:  Nonnutritive  Tools:  Nipple shield 24 mm Instructed on use and cleaning of tool:  Yes.    Pre-feed weight:  3732 g  (8 lb. 3.6 oz.) Post-feed weight:  3732 g (8 lb. 3.6 oz.) Amount transferred:  0 ml Amount supplemented:  90 ml  No  Total amount pumped post feed:  R 90 ml    L 95 ml  Total amount transferred:  0 ml  Total supplement given:  90 ml  Follow up with mom of 6 day old infant for feeding assistance. Mom reports she tries to put infant to breast but infant is sleepy at breast. Infant undressed, changed heavy wet diaper with large yellow stool.   Infant placed to right breast STS. She would latch to breast with the # 24 NS. She never got into a rhythmic pattern at the  breast and would display non nutritive suckling. She would then fall asleep. Awakening techniques not effective with getting infant to suckle. We tried for 15 minutes and then allowed infant to take bottle. Infant very sleepy taking bottle needing some stimulation to maintain suckling, GM fed infant bottle while mom pumped.  Mom's breasts were full. She pumped 185 ML with good easy flow  of milk. Breasts softened with feeding. Enc mom to keep offering infant breast with each feeding and to pump every 2-3 hours for 8-12 pumpings/day to maintain milk supply. Reviewed supply and demand and importance of frequent emptying of breast to maintain milk supply. Breast milk storage guidelines reviewed. Breasts are noted to still have edema present, mom also noted to have swollen feet. Enc elevation of feet with sitting, ambulating as tolerated, and drinking plenty of fluids.   Infant with follow up Ped appt on 6/5. Suspect feeding difficulties related to sleepiness and early term infant.   Mom reports her blood sugars have been low, enc mom to add calories as needed to maintain blood glucose and that BF requires additional calories. Mom to see Endocrinologist towards end of June.   Plan made with mom and written copy given:  Breastfeed infant at breast STS 8-12 x in 24 hours at first feeding cues using # 24 NS Pump post BF for 15- 20 minutes with DEBP Supplement with EBM (or formula as needed) 70-92 cc at least every 3 hours (555-740 cc/day) Call for assistance as needed [facility] @ 11:30 Keep up the good work.

## 2016-12-28 NOTE — Anesthesia Postprocedure Evaluation (Signed)
Anesthesia Post Note  Patient: Tammy Richards  Procedure(s) Performed: Procedure(s) (LRB): CESAREAN SECTION (N/A)     Anesthesia Post Evaluation  Last Vitals:  Vitals:   12/23/16 1720 12/24/16 0601  BP: 116/64 118/66  Pulse: (!) 118 83  Resp: 18 18  Temp: 36.8 C 37.3 C    Last Pain:  Vitals:   12/24/16 1715  TempSrc:   PainSc: 1                  Shelton SilvasKevin D Regla Fitzgibbon

## 2017-01-02 NOTE — Discharge Summary (Signed)
OB Discharge Summary     Patient Name: Tammy Richards DOB: Dec 27, 1990 MRN: [id number]  Date of admission: 12/21/2016 Delivering MD: Christophe Louis   Date of discharge:12/24/2016  Admitting diagnosis: 37w, Induction  Intrauterine pregnancy: [redacted]w[redacted]d    Secondary diagnosis:  Active Problems:   Type 1 diabetes mellitus affecting pregnancy in third trimester, antepartum  Additional problems: none     Discharge diagnosis: Term Pregnancy Delivered and Type 2 DM                                                                                                Post partum procedures:none  Augmentation: AROM, Pitocin and Cytotec  Complications: None  Hospital course:  Induction of Labor With Cesarean Section  26y.o. yo G1P1001 at 317w5das admitted to the hospital 12/21/2016 for induction of labor. Patient had a labor course significant for arrest of dilation at 9cm. The patient went for cesarean section due to Arrest of Dilation, and delivered a Viable See operative note for further information.  Patient had an uncomplicated postpartum course. She is ambulating, tolerating a regular diet, passing flatus, and urinating well.  Patient is discharged home in stable condition on 12/24/16.                                    Physical exam  Vitals:   12/23/16 0133 12/23/16 0533 12/23/16 1720 12/24/16 0601  BP: (!) 113/59 113/61 116/64 118/66  Pulse: 89 87 (!) 118 83  Resp: _0 Temp: 99 F (37.2 C) 98.3 F (36.8 C) 98.3 F (36.8 C) 99.2 F (37.3 C)  TempSrc: Oral Oral Oral Oral  SpO2: 95% 99%    Weight:      Height:       General: alert, cooperative and no distress Lochia: appropriate Uterine Fundus: firm Incision: Dressing is clean, dry, and intact DVT Evaluation: No evidence of DVT seen on physical exam. Labs: Lab Results  Component Value Date   WBC 15.0 (H) 12/23/2016   HGB 9.5 (L) 12/23/2016   HCT 28.3 (L) 12/23/2016   MCV 86.3 12/23/2016   PLT 131 (L) 12/23/2016   CMP  Latest Ref Rng & Units 12/21/2016  Glucose 65 - 99 mg/dL 135(H)  BUN 6 - 20 mg/dL 8  Creatinine 0.44 - 1.00 mg/dL 0.55  Sodium 135 - 145 mmol/L 136  Potassium 3.5 - 5.1 mmol/L 4.0  Chloride 101 - 111 mmol/L 105  CO2 22 - 32 mmol/L 22  Calcium 8.9 - 10.3 mg/dL 9.9  Total Protein 6.5 - 8.1 g/dL 6.9  Total Bilirubin 0.3 - 1.2 mg/dL 0.5  Alkaline Phos 38 - 126 U/L 205(H)  AST 15 - 41 U/L 20  ALT 14 - 54 U/L 18    Discharge instruction: per After Visit Summary and "Baby and Me Booklet".  After visit meds:  Allergies as of 12/24/2016   No Known Allergies     Medication List    TAKE these medications   [facility]  100 UNIT/ML Sopn Inject 0.35 mLs (35 Units total) into the skin daily. What changed:  how much to take   BD SHARPS COLLECTOR Misc Use container to dispose of sharps   cetirizine 10 MG tablet Commonly known as:  ZYRTEC Take 10 mg by mouth daily.   docusate sodium 100 MG capsule Commonly known as:  COLACE Take 1 capsule (100 mg total) by mouth 2 (two) times daily.   ferrous sulfate 325 (65 FE) MG tablet Take 1 tablet (325 mg total) by mouth daily with breakfast.   glucose blood test strip Commonly known as:  ONE TOUCH TEST STRIPS Test blood sugars 5 times daily - Dx: E10.8   ibuprofen 600 MG tablet Commonly known as:  ADVIL,MOTRIN Take 1 tablet (600 mg total) by mouth every 6 (six) hours.   insulin aspart 100 UNIT/ML FlexPen Commonly known as:  NOVOLOG FLEXPEN Inject 4 Units into the skin 3 (three) times daily with meals. What changed:  how much to take   ONE TOUCH ULTRA MINI w/Device Kit Test blood sugars 5 times daily - Dx: Y09.9   ONETOUCH DELICA LANCETS 83J Misc TEST BLOOD SUGARS 5 TIMES DAILY DX: E10.8   PRENATAL VITAMIN PO Take by mouth.   prochlorperazine 10 MG tablet Commonly known as:  COMPAZINE Take 1 tablet (10 mg total) by mouth 2 (two) times daily as needed for nausea or vomiting (headache). Take 46m of benadryl       Diet:  carb modified diet  Activity: Advance as tolerated. Pelvic rest for 6 weeks.   Outpatient follow up:2 weeks Follow up Appt:No future appointments. Follow up Visit:No Follow-up on file.  Postpartum contraception: Not Discussed  Newborn Data: Live born female  Birth Weight: 8 lb 10.5 oz (3925 g) APGAR: 8, 9  Baby Feeding: Breast Disposition:home with mother   01/02/2017 OJanyth Pupa M, DO

## 2017-04-19 ENCOUNTER — Encounter: Payer: Self-pay | Admitting: Adult Health

## 2017-07-25 ENCOUNTER — Encounter: Payer: Self-pay | Admitting: Physician Assistant

## 2017-07-25 ENCOUNTER — Other Ambulatory Visit: Payer: Self-pay

## 2017-07-25 ENCOUNTER — Ambulatory Visit (INDEPENDENT_AMBULATORY_CARE_PROVIDER_SITE_OTHER): Payer: 59 | Admitting: Physician Assistant

## 2017-07-25 VITALS — BP 122/72 | HR 100 | Temp 98.0°F | Resp 16 | Ht 63.0 in | Wt 190.0 lb

## 2017-07-25 DIAGNOSIS — J329 Chronic sinusitis, unspecified: Secondary | ICD-10-CM | POA: Diagnosis not present

## 2017-07-25 DIAGNOSIS — J310 Chronic rhinitis: Secondary | ICD-10-CM

## 2017-07-25 MED ORDER — AMOXICILLIN-POT CLAVULANATE 875-125 MG PO TABS: 1.0000 | ORAL_TABLET | Freq: Two times a day (BID) | ORAL | 0 refills | Status: AC

## 2017-07-25 NOTE — Patient Instructions (Addendum)
Please hydrate well with 64 oz of water if not more. You can use a saline nasal rinse for the nostrils. You can also do ibuprofen 400mg  every 6 hours.    Sinusitis, Adult Sinusitis is soreness and inflammation of your sinuses. Sinuses are hollow spaces in the bones around your face. They are located:  Around your eyes.  In the middle of your forehead.  Behind your nose.  In your cheekbones.  Your sinuses and nasal passages are lined with a stringy fluid (mucus). Mucus normally drains out of your sinuses. When your nasal tissues get inflamed or swollen, the mucus can get trapped or blocked so air cannot flow through your sinuses. This lets bacteria, viruses, and funguses grow, and that leads to infection. Follow these instructions at home: Medicines  Take, use, or apply over-the-counter and prescription medicines only as told by your doctor. These may include nasal sprays.  If you were prescribed an antibiotic medicine, take it as told by your doctor. Do not stop taking the antibiotic even if you start to feel better. Hydrate and Humidify  Drink enough water to keep your pee (urine) clear or pale yellow.  Use a cool mist humidifier to keep the humidity level in your home above 50%.  Breathe in steam for 10-15 minutes, 3-4 times a day or as told by your doctor. You can do this in the bathroom while a hot shower is running.  Try not to spend time in cool or dry air. Rest  Rest as much as possible.  Sleep with your head raised (elevated).  Make sure to get enough sleep each night. General instructions  Put a warm, moist washcloth on your face 3-4 times a day or as told by your doctor. This will help with discomfort.  Wash your hands often with soap and water. If there is no soap and water, use hand sanitizer.  Do not smoke. Avoid being around people who are smoking (secondhand smoke).  Keep all follow-up visits as told by your doctor. This is important. Contact a doctor  if:  You have a fever.  Your symptoms get worse.  Your symptoms do not get better within 10 days. Get help right away if:  You have a very bad headache.  You cannot stop throwing up (vomiting).  You have pain or swelling around your face or eyes.  You have trouble seeing.  You feel confused.  Your neck is stiff.  You have trouble breathing. This information is not intended to replace advice given to you by your health care provider. Make sure you discuss any questions you have with your health care provider. Document Released: 01/02/2008 Document Revised: 03/11/2016 Document Reviewed: 05/11/2015 Elsevier Interactive Patient Education  2018 [employer]     IF you received an x-ray today, you will receive an invoice from [facility]. Please contact [facility] at [phone] with questions or concerns regarding your invoice.   IF you received labwork today, you will receive an invoice from [facility]. Please contact [facility] at [phone] with questions or concerns regarding your invoice.   Our billing staff will not be able to assist you with questions regarding bills from these companies.  You will be contacted with the lab results as soon as they are available. The fastest way to get your results is to activate your My Chart account. Instructions are located on the last page of this paperwork. If you have not heard from us regarding the results in 2 weeks, please contact  this office.

## 2017-07-25 NOTE — Progress Notes (Signed)
PRIMARY CARE AT [facility] [address], [place] [address] [phone]  Date:  07/25/2017   Name:  Tammy Richards   DOB:  [birth year]   MRN:  [id number]  PCP:  Dorothyann Peng, NP    History of Present Illness:  Tammy Richards is a 26 y.o. female patient who presents to PCP with  Chief Complaint  Patient presents with  . Sore Throat    comes and goes/ x 3 wks  . Nasal Congestion    x 3 wks  . Cough    dry     3 weeks of nasal congestion.  Recent dry cough in the last couple of days.  This is not worse, supine.  She has sore throat intermittently.  Worse at night with her cool mist humidifier.  She is taking mucinex and flonase at this time.  7 month child has similar features at this time.  She reports that she was treated for a "sinus infection".    Patient Active Problem List   Diagnosis Date Noted  . Type 1 diabetes mellitus affecting pregnancy in third trimester, antepartum 12/21/2016  . Latent autoimmune diabetes in adults (LADA), managed as type 1 ([place]) 07/03/2016  . Diabetes mellitus during pregnancy, antepartum 07/03/2016  . Dehydration 12/25/2015  . DKA (diabetic ketoacidoses) ([place]) 12/24/2015  . Diabetes ([place]) 12/24/2015  . Hyponatremia 12/24/2015  . Acute kidney injury ([place]) 12/24/2015  . Obesity     Past Medical History:  Diagnosis Date  . Chicken pox   . Diabetes mellitus without complication ([place])    new dx 11/2015  . DKA (diabetic ketoacidoses) ([place])    11/2015  . HPV (human papilloma virus) infection   . Obesity   . Tachycardia   . Type 1 diabetes mellitus ([place])   . UTI (urinary tract infection)   . Vaginal Pap smear, abnormal     Past Surgical History:  Procedure Laterality Date  . CESAREAN SECTION N/A 12/22/2016   Procedure: CESAREAN SECTION;  Surgeon: Christophe Louis, MD;  Location: [facility];  Service: Obstetrics;  Laterality: N/A;  . COLPOSCOPY    . NO PAST SURGERIES      Social History   Tobacco Use  . Smoking status: Never  Smoker  . Smokeless tobacco: Never Used  Substance Use Topics  . Alcohol use: Yes    Alcohol/week: 0.0 oz    Comment: socially; once a month   . Drug use: No    Family History  Problem Relation Age of Onset  . Thyroid disease Mother   . Thyroid disease Maternal Grandmother   . Hyperlipidemia Paternal Grandmother   . Arthritis Paternal Grandmother   . Diabetes Paternal Grandmother   . Lung cancer Paternal Grandmother   . Hyperlipidemia Paternal Grandfather   . Cancer Paternal Grandfather   . Breast cancer Maternal Aunt   . Lung cancer Maternal Aunt   . Stroke Unknown        maternal great grandmother  . Hypertension Unknown        maternal great grandmother  . Diabetes Other        Maternal great grandmother    No Known Allergies  Medication list has been reviewed and updated.  Current Outpatient Medications on File Prior to Visit  Medication Sig Dispense Refill  . Blood Glucose Monitoring Suppl (ONE TOUCH ULTRA MINI) w/Device KIT Test blood sugars 5 times daily - Dx: E10.8 1 each 0  . cetirizine (ZYRTEC) 10 MG tablet Take 10  mg by mouth daily.    Marland Kitchen glucose blood (ONE TOUCH TEST STRIPS) test strip Test blood sugars 5 times daily - Dx: E10.8 100 each 12  . insulin aspart (NOVOLOG FLEXPEN) 100 UNIT/ML FlexPen Inject 4 Units into the skin 3 (three) times daily with meals. (Patient taking differently: Inject 5-10 Units into the skin 3 (three) times daily with meals. ) 5 pen 0  . Insulin Glargine (BASAGLAR KWIKPEN) 100 UNIT/ML SOPN Inject 0.35 mLs (35 Units total) into the skin daily. (Patient taking differently: Inject 58 Units into the skin daily. ) 5 pen 0  . ONETOUCH DELICA LANCETS 16X MISC TEST BLOOD SUGARS 5 TIMES DAILY DX: E10.8 100 each 3  . Radiation protection practitioner (BD SHARPS COLLECTOR) MISC Use container to dispose of sharps 1 each 0  . docusate sodium (COLACE) 100 MG capsule Take 1 capsule (100 mg total) by mouth 2 (two) times daily. (Patient not taking: Reported on 07/25/2017)  30 capsule 0  . ferrous sulfate 325 (65 FE) MG tablet Take 1 tablet (325 mg total) by mouth daily with breakfast. (Patient not taking: Reported on 07/25/2017) 30 tablet 0  . ibuprofen (ADVIL,MOTRIN) 600 MG tablet Take 1 tablet (600 mg total) by mouth every 6 (six) hours. (Patient not taking: Reported on 07/25/2017) 30 tablet 0  . Prenatal Vit-Fe Fumarate-FA (PRENATAL VITAMIN PO) Take by mouth.    . prochlorperazine (COMPAZINE) 10 MG tablet Take 1 tablet (10 mg total) by mouth 2 (two) times daily as needed for nausea or vomiting (headache). Take '25mg'$  of benadryl (Patient not taking: Reported on 12/21/2016) 5 tablet 0   No current facility-administered medications on file prior to visit.     ROS ROS otherwise unremarkable unless listed above.  Physical Examination: BP 122/72   Pulse 100   Temp 98 F (36.7 C) (Oral)   Resp 16   Ht '5\' 3"'$  (1.6 m)   Wt 190 lb (86.2 kg)   SpO2 98%   Breastfeeding? Yes   BMI 33.66 kg/m  Ideal Body Weight: Weight in (lb) to have BMI = 25: 140.8  Physical Exam  Constitutional: She is oriented to person, place, and time. She appears well-developed and well-nourished. No distress.  HENT:  Head: Normocephalic and atraumatic.  Right Ear: Tympanic membrane, external ear and ear canal normal.  Left Ear: Tympanic membrane, external ear and ear canal normal.  Nose: Mucosal edema and rhinorrhea present. Right sinus exhibits no maxillary sinus tenderness and no frontal sinus tenderness. Left sinus exhibits no maxillary sinus tenderness and no frontal sinus tenderness.  Mouth/Throat: No uvula swelling. No oropharyngeal exudate, posterior oropharyngeal edema or posterior oropharyngeal erythema.  Eyes: Conjunctivae and EOM are normal. Pupils are equal, round, and reactive to light.  Cardiovascular: Normal rate and regular rhythm. Exam reveals no gallop, no distant heart sounds and no friction rub.  No murmur heard. Pulmonary/Chest: Effort normal. No respiratory  distress. She has no decreased breath sounds. She has no wheezes. She has no rhonchi.  Lymphadenopathy:       Head (right side): No submandibular, no tonsillar, no preauricular and no posterior auricular adenopathy present.       Head (left side): No submandibular, no tonsillar, no preauricular and no posterior auricular adenopathy present.  Neurological: She is alert and oriented to person, place, and time.  Skin: She is not diaphoretic.  Psychiatric: She has a normal mood and affect. Her behavior is normal.     Assessment and Plan: Tammy Richards is a 26  y.o. female who is here today for cc of  Chief Complaint  Patient presents with  . Sore Throat    comes and goes/ x 3 wks  . Nasal Congestion    x 3 wks  . Cough    dry   Rhinosinusitis - Plan: amoxicillin-clavulanate (AUGMENTIN) 875-125 MG tablet  Ivar Drape, PA-C [facility] 12/27/20188:58 AM

## 2017-08-08 ENCOUNTER — Other Ambulatory Visit: Payer: Self-pay | Admitting: Obstetrics and Gynecology

## 2017-08-08 ENCOUNTER — Other Ambulatory Visit (HOSPITAL_COMMUNITY)
Admission: RE | Admit: 2017-08-08 | Discharge: 2017-08-08 | Disposition: A | Discharge status: Home / Self Care | Payer: 59 | Source: Ambulatory Visit | Attending: Obstetrics and Gynecology | Admitting: Obstetrics and Gynecology

## 2017-08-08 DIAGNOSIS — Z01419 Encounter for gynecological examination (general) (routine) without abnormal findings: Secondary | ICD-10-CM | POA: Insufficient documentation

## 2017-08-09 LAB — CYTOLOGY - PAP: Diagnosis: NEGATIVE

## 2017-11-06 ENCOUNTER — Encounter: Payer: Self-pay | Admitting: Physician Assistant

## 2017-11-21 IMAGING — US US MFM OB FOLLOW-UP
1 series · 13 of 28 positions shown · non-contrast
Comparison: none

[Series 1: us mfm ob follow-up · 13 of 52 slices shown]
[im 2/52]
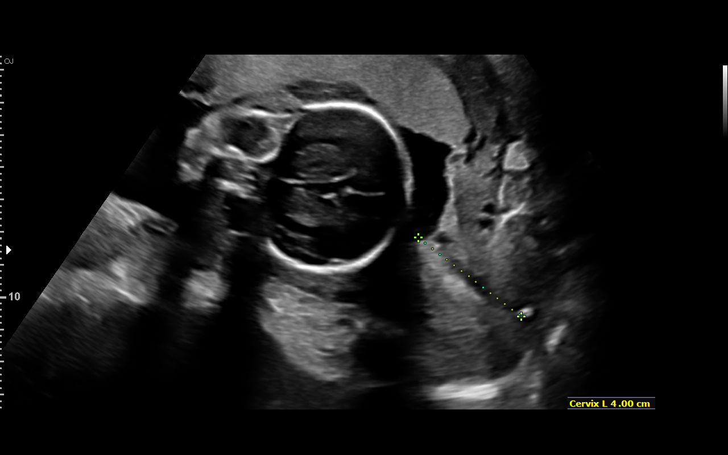
[im 6/52]
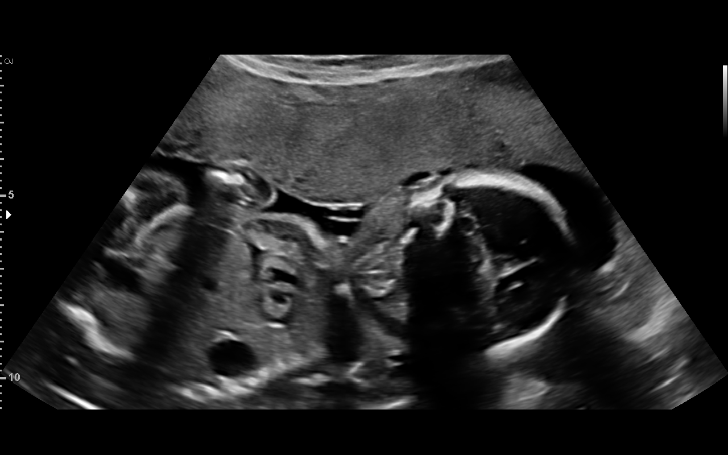
[im 10/52]
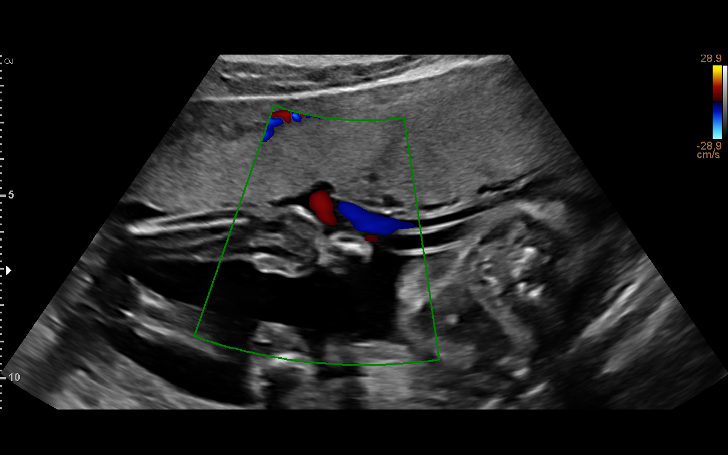
[im 14/52]
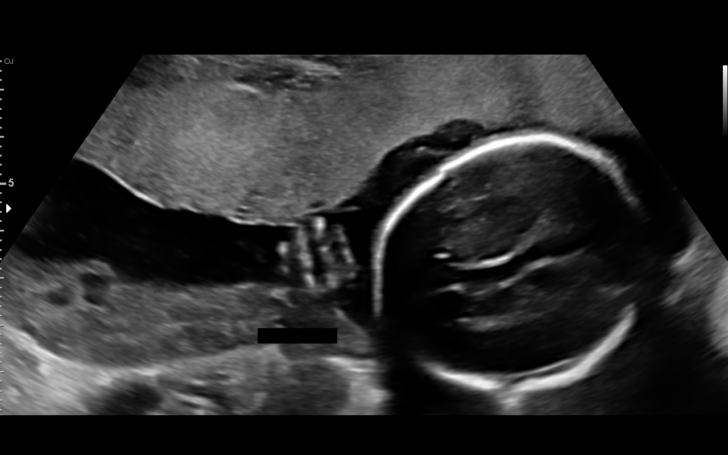
[im 18/52]
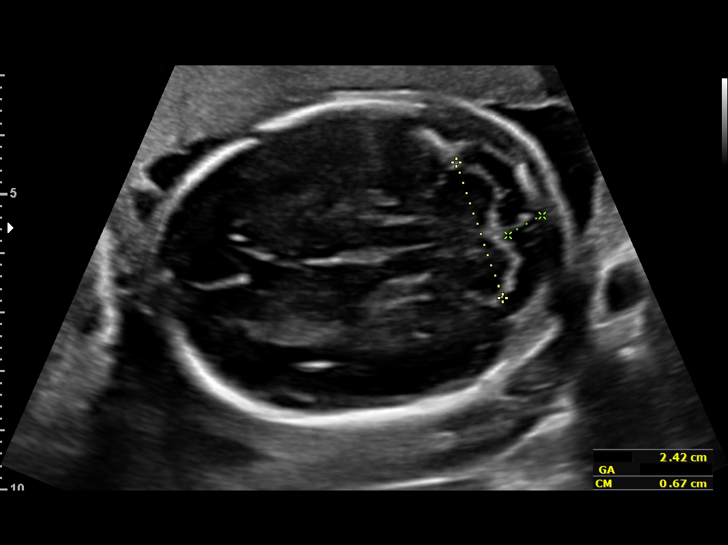
[im 21/52]
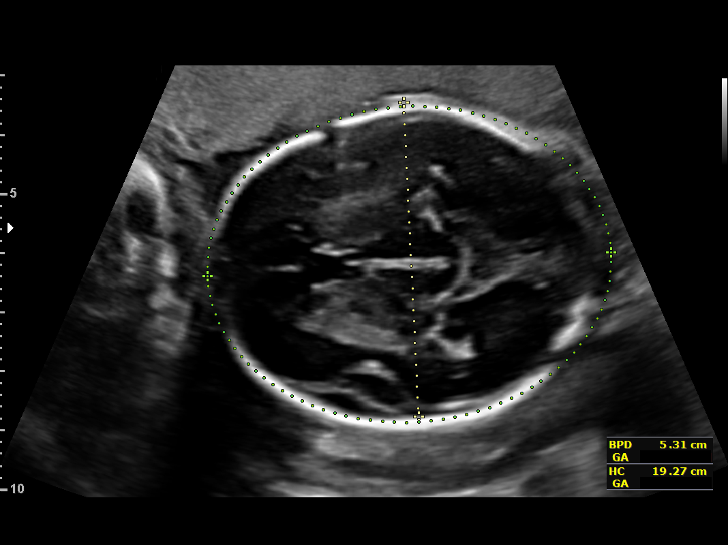
[im 27/52]
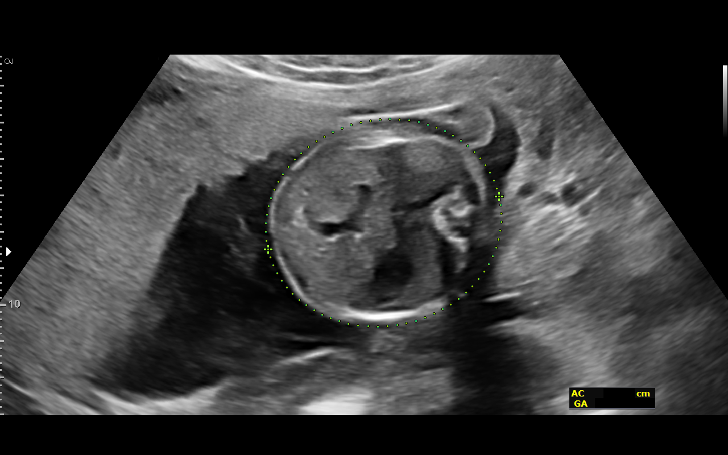
[im 31/52]
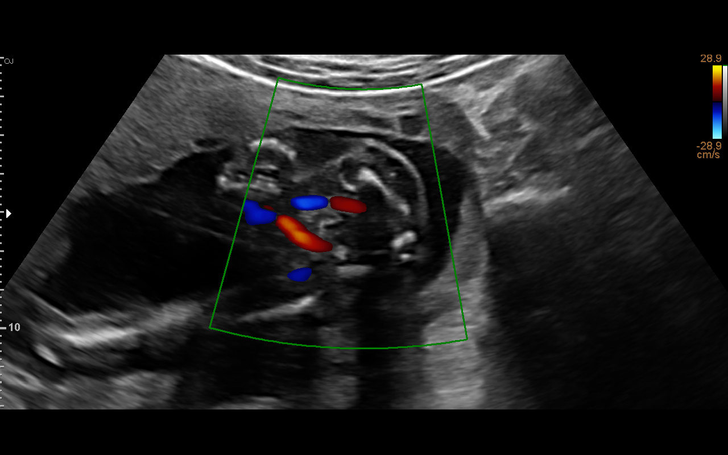
[im 35/52]
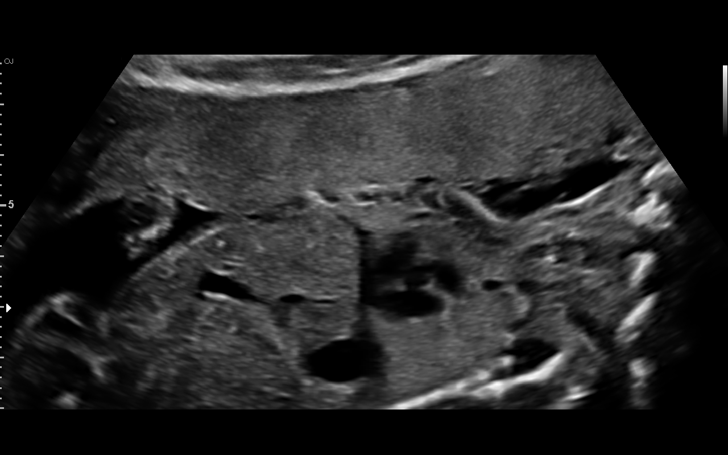
[im 38/52]
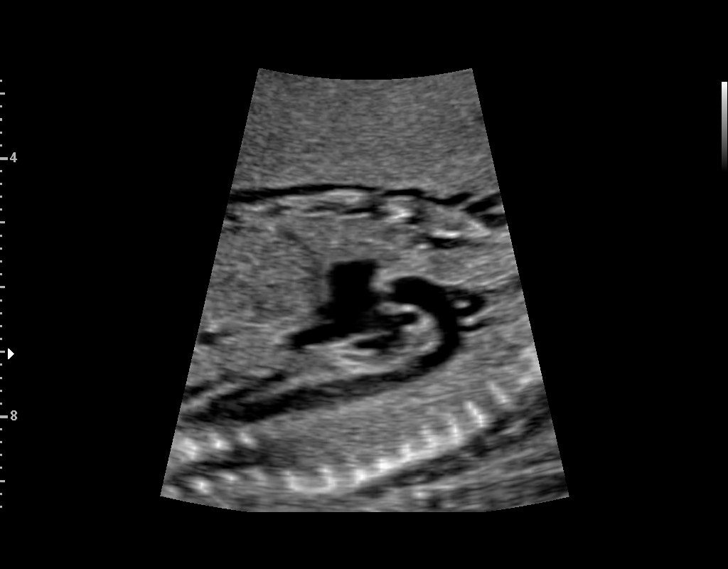
[im 42/52]
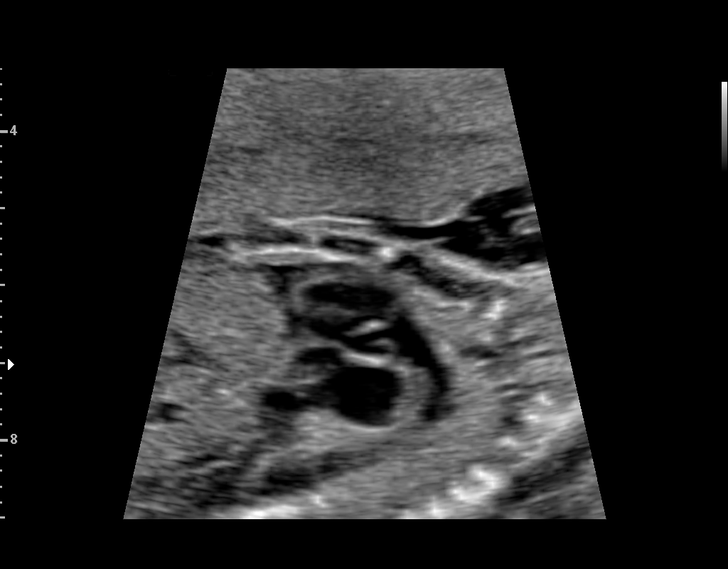
[im 46/52]
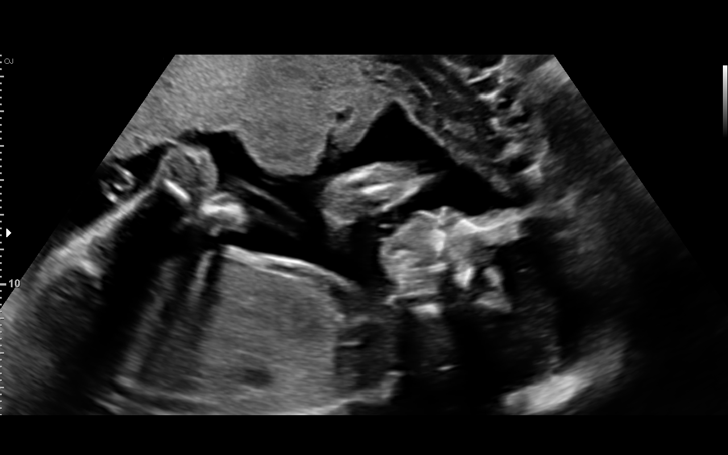
[im 50/52]
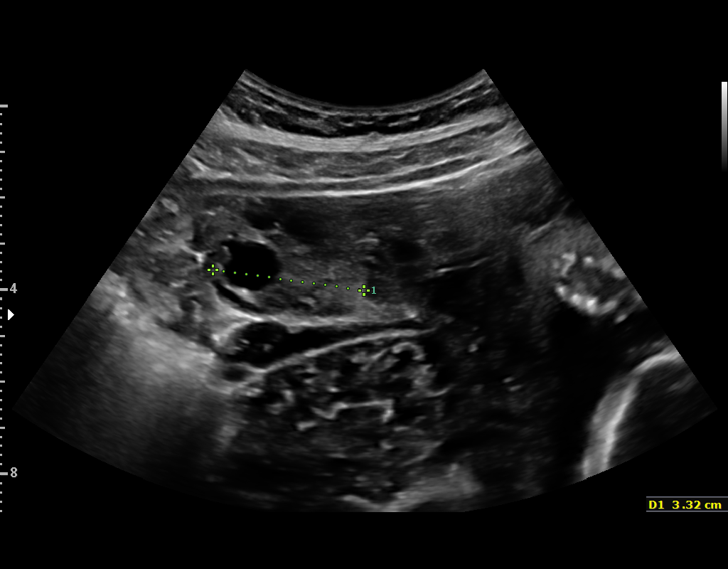

[13 of 28 positions shown; findings below may reference images not displayed]

[REDACTED]
Ave., [HOSPITAL]

1  HEIHEI BORIGAS              [id number]      [id number]     [id number]
Indications

23 weeks gestation of pregnancy
Pre-existing diabetes, type 1, in pregnancy,
second trimester-insulin
Encounter for other antenatal screening
follow-up
Obesity complicating pregnancy, second
trimester
OB History

Blood Type:            Height:  5'3"   Weight (lb):  194       BMI:
Gravidity:    1         Term:   0        Prem:   0         SAB:   0
TOP:          0       Ectopic:  0        Living: 0
Fetal Evaluation

Num Of Fetuses:     1
Fetal Heart         148
Rate(bpm):
Cardiac Activity:   Observed
Presentation:       Cephalic
Placenta:           Anterior, above cervical os
P. Cord Insertion:  Visualized

Amniotic Fluid
AFI FV:      Subjectively within normal limits

Largest Pocket(cm)
4.07
Biometry
BPD:      53.1  mm     G. Age:  22w 1d         13  %    CI:         72.66  %    70 - 86
FL/HC:       20.1  %    19.2 -
HC:      198.1  mm     G. Age:  22w 0d          6  %    HC/AC:       1.03       1.05 -
AC:      191.8  mm     G. Age:  23w 6d         66  %    FL/BPD:      75.1  %    71 - 87
FL:       39.9  mm     G. Age:  22w 6d         30  %    FL/AC:       20.8  %    20 - 24
HUM:      37.7  mm     G. Age:  23w 2d         45  %
CER:      24.2  mm     G. Age:  22w 1d         35  %
CM:        6.7  mm

Est. FW:     576   gm     1 lb 4 oz     55  %
Gestational Age

LMP:           23w 1d        Date:  04/02/16                 EDD:    01/07/17
U/S Today:     22w 5d                                        EDD:    01/10/17
Best:          23w 1d     Det. By:  LMP  (04/02/16)          EDD:    01/07/17
Anatomy

Cranium:               Appears normal         Aortic Arch:            Appears normal
Cavum:                 Appears normal         Ductal Arch:            Appears normal
Ventricles:            Appears normal         Diaphragm:              Appears normal
Choroid Plexus:        Appears normal         Stomach:                Appears normal, left
sided
Cerebellum:            Appears normal         Abdomen:                Appears normal
Posterior Fossa:       Appears normal         Abdominal Wall:         Previously seen
Nuchal Fold:           Previously seen        Cord Vessels:           Appears normal (3
vessel cord)
Face:                  Orbits and profile     Kidneys:                Appear normal
previously seen
Lips:                  Appears normal         Bladder:                Appears normal
Thoracic:              Appears normal         Spine:                  Previously seen
Heart:                 Previously seen        Upper Extremities:      Previously seen
RVOT:                  Appears normal         Lower Extremities:      Previously seen
LVOT:                  Appears normal

Other:  Fetus appears to be a female. Heels prev. visualized.
Cervix Uterus Adnexa

Cervix
Length:              4  cm.
Normal appearance by transabdominal scan.

Uterus
No abnormality visualized.

Left Ovary
Within normal limits.

Right Ovary
Within normal limits.
Impression

SIUP at 23+1 weeks
Normal interval anatomy; anatomic survey complete
Normal amniotic fluid volume
Appropriate interval growth with EFW at the 55th %tile
Recommendations

Follow-up ultrasound for growth in 4 weeks or serial
ultrasounds for growth in office

## 2018-08-11 ENCOUNTER — Other Ambulatory Visit (HOSPITAL_COMMUNITY)
Admission: RE | Admit: 2018-08-11 | Discharge: 2018-08-11 | Disposition: A | Discharge status: Home / Self Care | Payer: 59 | Source: Ambulatory Visit | Attending: Obstetrics and Gynecology | Admitting: Obstetrics and Gynecology

## 2018-08-11 ENCOUNTER — Other Ambulatory Visit: Payer: Self-pay | Admitting: Obstetrics and Gynecology

## 2018-08-11 DIAGNOSIS — Z01419 Encounter for gynecological examination (general) (routine) without abnormal findings: Secondary | ICD-10-CM | POA: Diagnosis not present

## 2018-08-27 LAB — CYTOLOGY - PAP: Diagnosis: NEGATIVE

## 2018-10-27 ENCOUNTER — Other Ambulatory Visit: Payer: Self-pay | Admitting: Endocrinology

## 2018-10-27 DIAGNOSIS — E010 Iodine-deficiency related diffuse (endemic) goiter: Secondary | ICD-10-CM

## 2018-12-15 ENCOUNTER — Other Ambulatory Visit: Payer: 59

## 2018-12-30 ENCOUNTER — Ambulatory Visit
Admission: RE | Admit: 2018-12-30 | Discharge: 2018-12-30 | Disposition: A | Discharge status: Home / Self Care | Payer: 59 | Source: Ambulatory Visit | Attending: Endocrinology | Admitting: Endocrinology

## 2018-12-30 DIAGNOSIS — E010 Iodine-deficiency related diffuse (endemic) goiter: Secondary | ICD-10-CM

## 2019-01-07 ENCOUNTER — Other Ambulatory Visit: Payer: Self-pay | Admitting: Endocrinology

## 2019-01-07 DIAGNOSIS — E010 Iodine-deficiency related diffuse (endemic) goiter: Secondary | ICD-10-CM

## 2019-01-08 ENCOUNTER — Other Ambulatory Visit: Payer: Self-pay | Admitting: Endocrinology

## 2019-01-08 DIAGNOSIS — E010 Iodine-deficiency related diffuse (endemic) goiter: Secondary | ICD-10-CM

## 2019-01-13 ENCOUNTER — Other Ambulatory Visit: Payer: Self-pay

## 2019-01-13 ENCOUNTER — Ambulatory Visit
Admission: RE | Admit: 2019-01-13 | Discharge: 2019-01-13 | Disposition: A | Discharge status: Home / Self Care | Payer: 59 | Source: Ambulatory Visit | Attending: Endocrinology | Admitting: Endocrinology

## 2019-01-13 ENCOUNTER — Other Ambulatory Visit (HOSPITAL_COMMUNITY)
Admission: RE | Admit: 2019-01-13 | Discharge: 2019-01-13 | Disposition: A | Discharge status: Home / Self Care | Payer: 59 | Source: Ambulatory Visit | Attending: Radiology | Admitting: Radiology

## 2019-01-13 DIAGNOSIS — E010 Iodine-deficiency related diffuse (endemic) goiter: Secondary | ICD-10-CM | POA: Insufficient documentation

## 2019-11-16 IMAGING — US US THYROID
1 series · 13 of 25 positions shown · non-contrast
Comparison: None.

CLINICAL DATA: Palpable abnormality.  Thyromegaly.

EXAM:
THYROID ULTRASOUND
TECHNIQUE: Ultrasound examination of the thyroid gland and adjacent soft
tissues was performed.

[Series 1: us thyroid · 0.07mm/px · 13 of 55 slices shown]
[im 1/55]
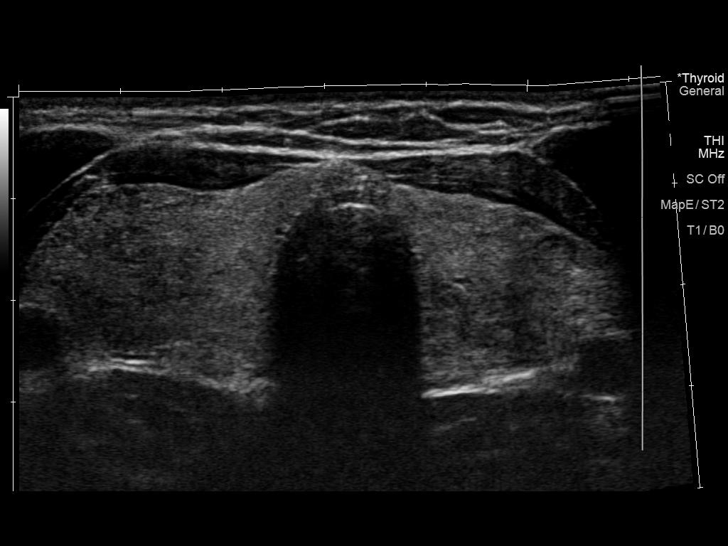
[im 5/55]
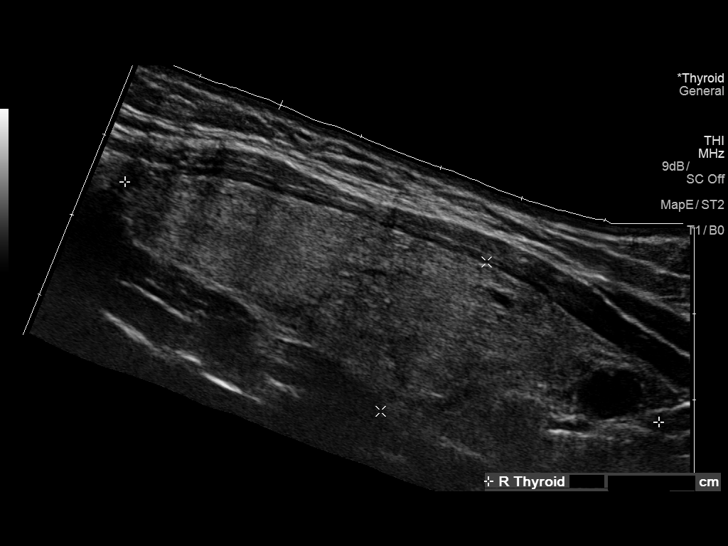
[im 10/55]
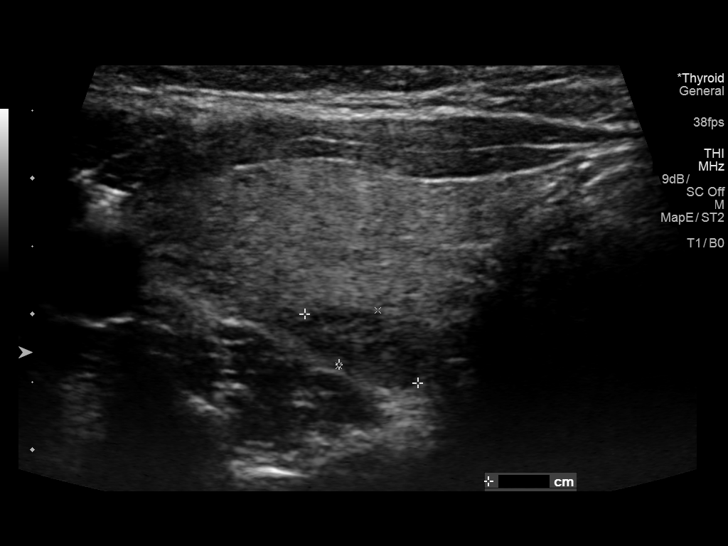
[im 14/55]
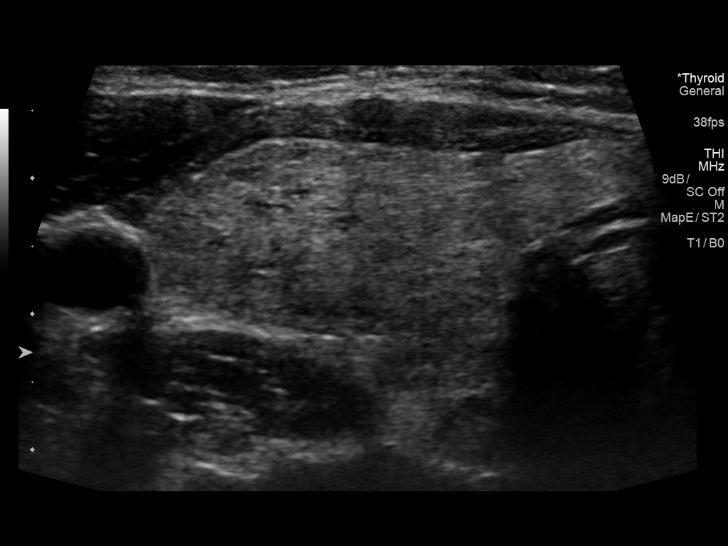
[im 19/55]
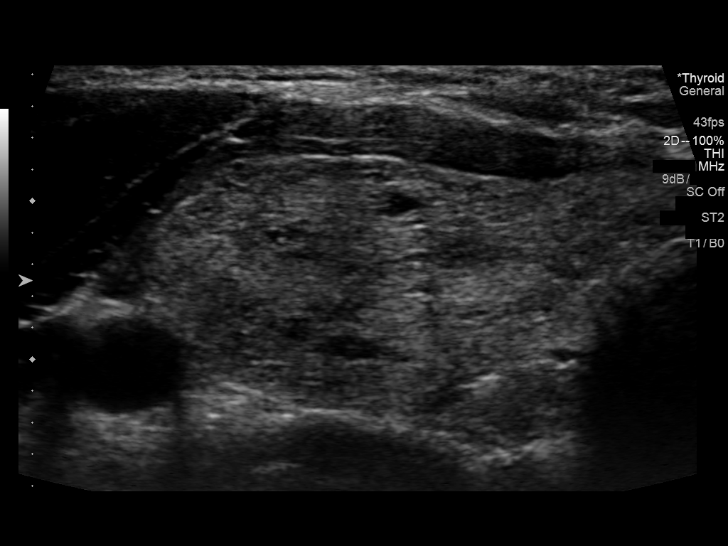
[im 23/55]
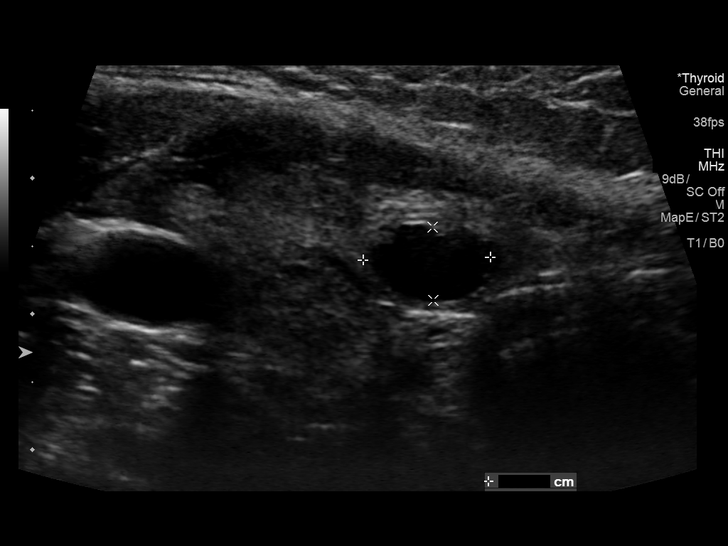
[im 28/55]
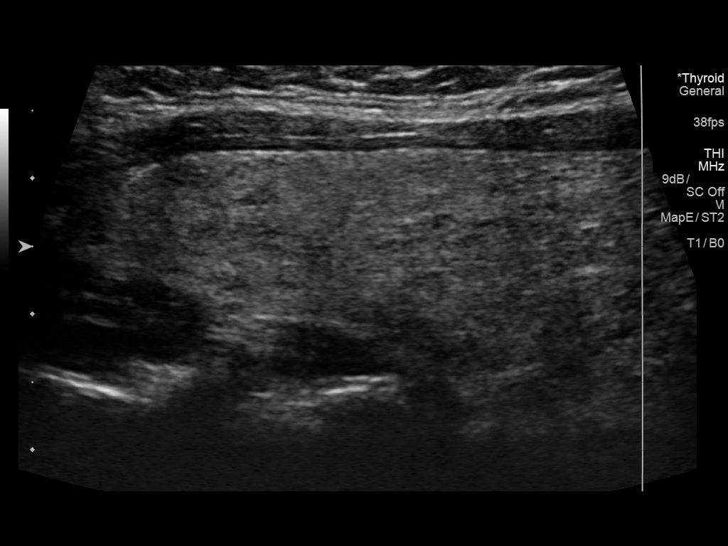
[im 32/55]
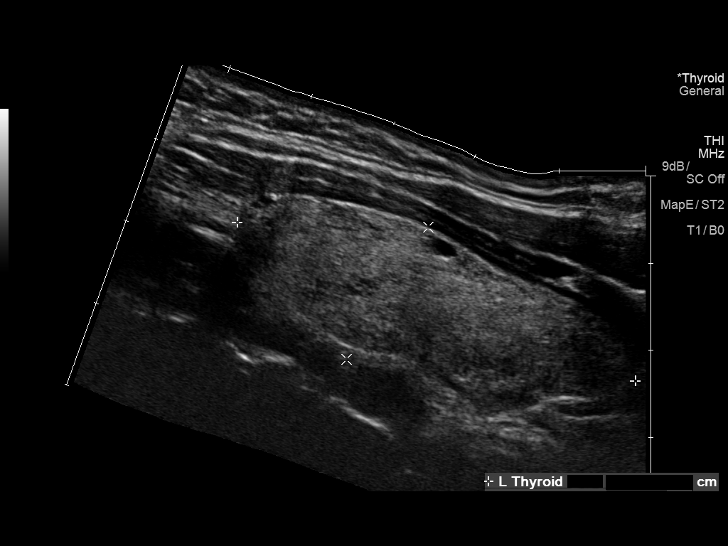
[im 37/55]
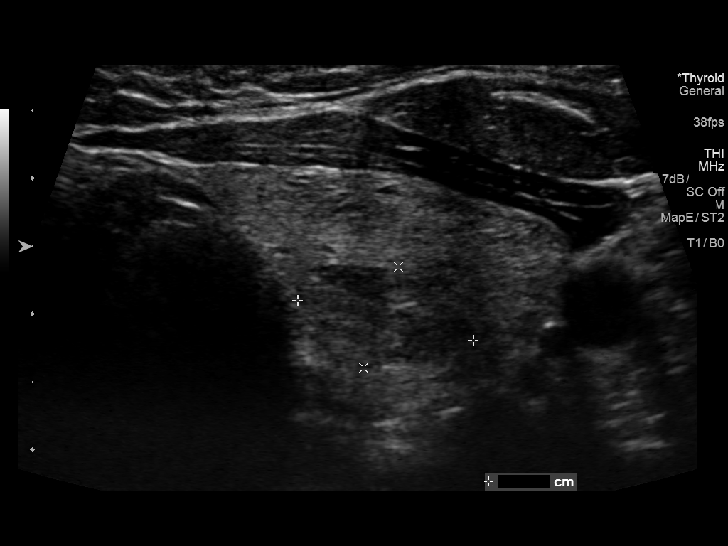
[im 41/55]
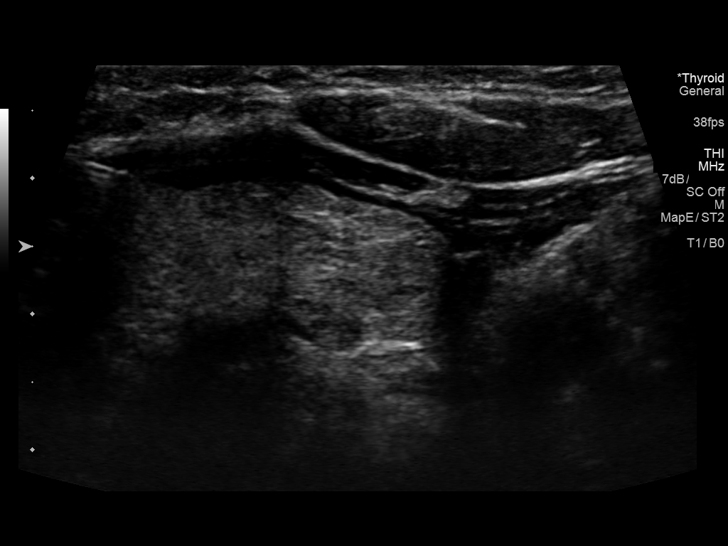
[im 46/55]
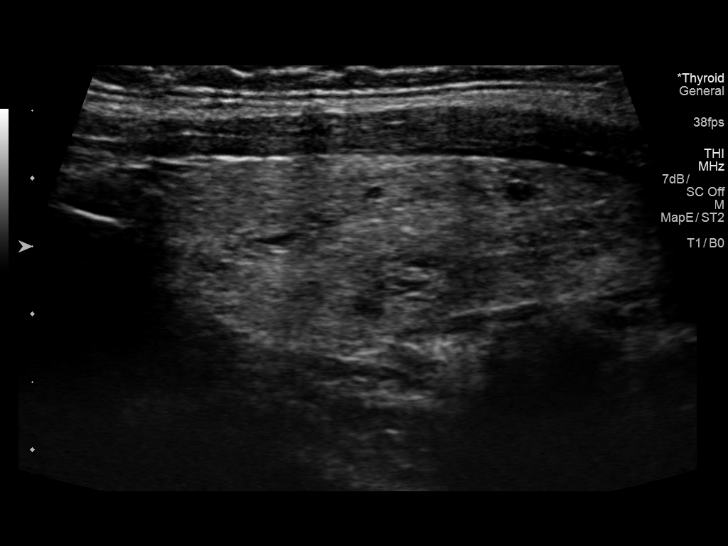
[im 50/55]
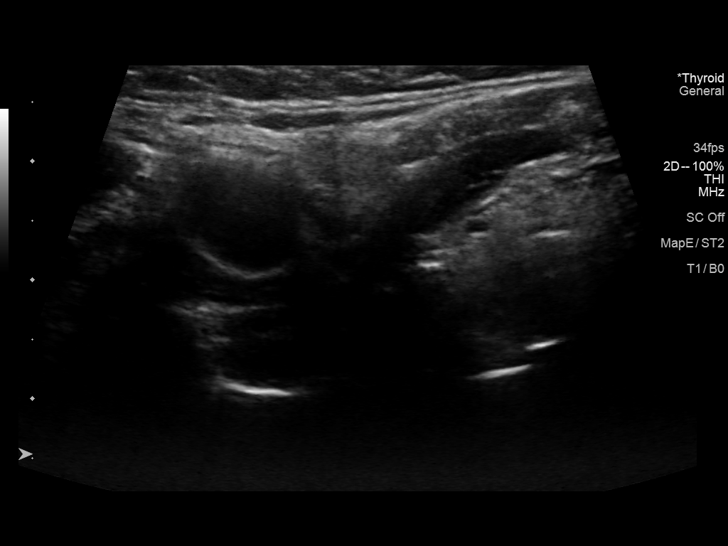
[im 55/55]
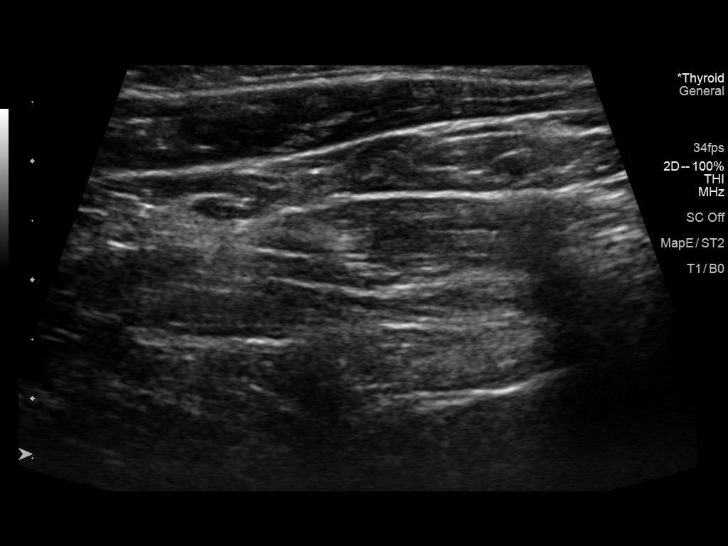

[13 of 25 positions shown; findings below may reference images not displayed]

FINDINGS: Parenchymal Echotexture: Moderately heterogenous

Isthmus: 0.6 cm

Right lobe: 6.8 x 2.1 x 2.6 cm

Left lobe: 4.9 x 1.8 x 2.1 cm

_________________________________________________________

Estimated total number of nodules >/= 1 cm: 4

Number of spongiform nodules >/=  2 cm not described below (TR1): 0

Number of mixed cystic and solid nodules >/= 1.5 cm not described
below (TR2): 0

_________________________________________________________

Nodule # 1:

Location: Right; Mid

Maximum size: 1.2 cm; Other 2 dimensions: 1.0 x 0.5 cm

Composition: solid/almost completely solid (2)

Echogenicity: hypoechoic (2)

Shape: not taller-than-wide (0)

Margins: smooth (0)

Echogenic foci: none (0)

ACR TI-RADS total points: 4.

ACR TI-RADS risk category: TR4 (4-6 points).

ACR TI-RADS recommendations:

*Given size (>/= 1 - 1.4 cm) and appearance, a follow-up ultrasound
in 1 year should be considered based on TI-RADS criteria.

_________________________________________________________

Right lower pole nodule 2 measures 0.7 cm and does not meet criteria
for biopsy nor follow-up

Right lower pole nodule 3 measures up to 1.4 cm with isoechoic
characteristics. It does not meet criteria for biopsy nor follow-up

Right lower pole nodule 4 measures 0.9 cm and does not meet criteria
for biopsy nor follow-up.

Nodule # 5:

Location: Left; Mid

Maximum size: 1.3 cm; Other 2 dimensions: 1.3 x 0.8 cm

Composition: solid/almost completely solid (2)

Echogenicity: hypoechoic (2)

Shape: not taller-than-wide (0)

Margins: smooth (0)

Echogenic foci: none (0)

ACR TI-RADS total points: 4.

ACR TI-RADS risk category: TR4 (4-6 points).

ACR TI-RADS recommendations:

*Given size (>/= 1 - 1.4 cm) and appearance, a follow-up ultrasound
in 1 year should be considered based on TI-RADS criteria.

_________________________________________________________
IMPRESSION: Nodules 1 and 5 meet criteria for annual follow-up.

Other nodules do not meet criteria for biopsy nor follow-up.

The above is in keeping with the ACR TI-RADS recommendations - [HOSPITAL] 0715;[DATE].

## 2022-04-18 ENCOUNTER — Other Ambulatory Visit: Payer: Self-pay | Admitting: Endocrinology

## 2022-04-18 DIAGNOSIS — E010 Iodine-deficiency related diffuse (endemic) goiter: Secondary | ICD-10-CM

## 2022-04-30 ENCOUNTER — Ambulatory Visit
Admission: RE | Admit: 2022-04-30 | Discharge: 2022-04-30 | Disposition: A | Discharge status: Home / Self Care | Payer: 59 | Source: Ambulatory Visit | Attending: Endocrinology | Admitting: Endocrinology

## 2022-04-30 DIAGNOSIS — E010 Iodine-deficiency related diffuse (endemic) goiter: Secondary | ICD-10-CM

## 2023-12-20 ENCOUNTER — Other Ambulatory Visit: Payer: Self-pay | Admitting: Endocrinology

## 2023-12-20 DIAGNOSIS — E010 Iodine-deficiency related diffuse (endemic) goiter: Secondary | ICD-10-CM

## 2023-12-31 ENCOUNTER — Ambulatory Visit
Admission: RE | Admit: 2023-12-31 | Discharge: 2023-12-31 | Disposition: A | Discharge status: Home / Self Care | Source: Ambulatory Visit | Attending: Endocrinology | Admitting: Endocrinology

## 2023-12-31 DIAGNOSIS — E010 Iodine-deficiency related diffuse (endemic) goiter: Secondary | ICD-10-CM
